# Patient Record
Sex: Male | Born: 1945 | Race: White | Hispanic: No | Marital: Married | Smoking: Former smoker
Health system: Southern US, Community
[De-identification: ages and names within clinical notes are randomized; demographics above are authoritative.]

## PROBLEM LIST (undated history)

## (undated) DIAGNOSIS — C801 Malignant (primary) neoplasm, unspecified: Secondary | ICD-10-CM

## (undated) DIAGNOSIS — E119 Type 2 diabetes mellitus without complications: Secondary | ICD-10-CM

## (undated) DIAGNOSIS — D649 Anemia, unspecified: Secondary | ICD-10-CM

## (undated) DIAGNOSIS — N289 Disorder of kidney and ureter, unspecified: Secondary | ICD-10-CM

## (undated) DIAGNOSIS — H3552 Pigmentary retinal dystrophy: Secondary | ICD-10-CM

## (undated) DIAGNOSIS — I10 Essential (primary) hypertension: Secondary | ICD-10-CM

## (undated) DIAGNOSIS — H543 Unqualified visual loss, both eyes: Secondary | ICD-10-CM

## (undated) DIAGNOSIS — R197 Diarrhea, unspecified: Secondary | ICD-10-CM

## (undated) HISTORY — DX: Anemia, unspecified: D64.9

## (undated) HISTORY — DX: Essential (primary) hypertension: I10

## (undated) HISTORY — DX: Type 2 diabetes mellitus without complications: E11.9

## (undated) HISTORY — PX: COLONOSCOPY: SHX174

---

## 1984-07-12 HISTORY — PX: EYE SURGERY: SHX253

## 1990-07-12 HISTORY — PX: TONSILLECTOMY: SUR1361

## 2006-04-28 ENCOUNTER — Ambulatory Visit (HOSPITAL_BASED_OUTPATIENT_CLINIC_OR_DEPARTMENT_OTHER): Admission: RE | Admit: 2006-04-28 | Discharge: 2006-04-28 | Payer: Self-pay | Admitting: Orthopedic Surgery

## 2006-04-28 ENCOUNTER — Encounter (INDEPENDENT_AMBULATORY_CARE_PROVIDER_SITE_OTHER): Payer: Self-pay | Admitting: Specialist

## 2006-07-12 HISTORY — PX: ELBOW SURGERY: SHX618

## 2007-07-13 HISTORY — PX: RECTAL SURGERY: SHX760

## 2007-09-22 ENCOUNTER — Encounter: Admission: RE | Admit: 2007-09-22 | Discharge: 2007-09-22 | Payer: Self-pay | Admitting: Surgery

## 2007-09-25 ENCOUNTER — Ambulatory Visit: Payer: Self-pay | Admitting: Hematology and Oncology

## 2007-09-28 LAB — COMPREHENSIVE METABOLIC PANEL
AST: 19 U/L (ref 0–37)
Albumin: 4.5 g/dL (ref 3.5–5.2)
Alkaline Phosphatase: 85 U/L (ref 39–117)
Calcium: 9.8 mg/dL (ref 8.4–10.5)
Chloride: 101 mEq/L (ref 96–112)
Glucose, Bld: 217 mg/dL — ABNORMAL HIGH (ref 70–99)
Potassium: 4.4 mEq/L (ref 3.5–5.3)
Sodium: 138 mEq/L (ref 135–145)
Total Protein: 7.4 g/dL (ref 6.0–8.3)

## 2007-09-28 LAB — CBC WITH DIFFERENTIAL/PLATELET
Eosinophils Absolute: 0.2 10*3/uL (ref 0.0–0.5)
MCV: 88.4 fL (ref 81.6–98.0)
MONO%: 6.4 % (ref 0.0–13.0)
NEUT#: 5.9 10*3/uL (ref 1.5–6.5)
RBC: 3.88 10*6/uL — ABNORMAL LOW (ref 4.20–5.71)
RDW: 13.7 % (ref 11.2–14.6)
WBC: 7.3 10*3/uL (ref 4.0–10.0)
lymph#: 0.8 10*3/uL — ABNORMAL LOW (ref 0.9–3.3)

## 2007-10-09 ENCOUNTER — Ambulatory Visit (HOSPITAL_COMMUNITY): Admission: RE | Admit: 2007-10-09 | Discharge: 2007-10-09 | Payer: Self-pay | Admitting: Surgery

## 2007-10-16 ENCOUNTER — Ambulatory Visit: Admission: RE | Admit: 2007-10-16 | Discharge: 2007-12-18 | Payer: Self-pay | Admitting: Radiation Oncology

## 2007-10-25 ENCOUNTER — Ambulatory Visit (HOSPITAL_COMMUNITY): Admission: RE | Admit: 2007-10-25 | Discharge: 2007-10-25 | Payer: Self-pay | Admitting: Hematology and Oncology

## 2007-11-02 LAB — CBC WITH DIFFERENTIAL/PLATELET
BASO%: 0.2 % (ref 0.0–2.0)
Basophils Absolute: 0 10*3/uL (ref 0.0–0.1)
EOS%: 4.4 % (ref 0.0–7.0)
HCT: 36.1 % — ABNORMAL LOW (ref 38.7–49.9)
HGB: 12.8 g/dL — ABNORMAL LOW (ref 13.0–17.1)
MCH: 31.4 pg (ref 28.0–33.4)
MCHC: 35.4 g/dL (ref 32.0–35.9)
MCV: 88.7 fL (ref 81.6–98.0)
MONO%: 7.4 % (ref 0.0–13.0)
NEUT%: 71.8 % (ref 40.0–75.0)

## 2007-11-06 ENCOUNTER — Ambulatory Visit: Payer: Self-pay | Admitting: Hematology and Oncology

## 2007-11-08 LAB — CBC WITH DIFFERENTIAL/PLATELET
BASO%: 0.7 % (ref 0.0–2.0)
Basophils Absolute: 0 10*3/uL (ref 0.0–0.1)
EOS%: 2.8 % (ref 0.0–7.0)
MCH: 31.5 pg (ref 28.0–33.4)
MCHC: 35.6 g/dL (ref 32.0–35.9)
MCV: 88.4 fL (ref 81.6–98.0)
MONO%: 7 % (ref 0.0–13.0)
RBC: 3.85 10*6/uL — ABNORMAL LOW (ref 4.20–5.71)
RDW: 13.9 % (ref 11.2–14.6)
lymph#: 0.9 10*3/uL (ref 0.9–3.3)

## 2007-11-15 LAB — CBC WITH DIFFERENTIAL/PLATELET
Basophils Absolute: 0 10*3/uL (ref 0.0–0.1)
EOS%: 6.8 % (ref 0.0–7.0)
HGB: 11.4 g/dL — ABNORMAL LOW (ref 13.0–17.1)
MCH: 31.7 pg (ref 28.0–33.4)
MCHC: 35.3 g/dL (ref 32.0–35.9)
MCV: 89.7 fL (ref 81.6–98.0)
MONO%: 6.5 % (ref 0.0–13.0)
RBC: 3.6 10*6/uL — ABNORMAL LOW (ref 4.20–5.71)
RDW: 13.8 % (ref 11.2–14.6)

## 2007-11-22 LAB — CBC WITH DIFFERENTIAL/PLATELET
BASO%: 0.2 % (ref 0.0–2.0)
Basophils Absolute: 0 10*3/uL (ref 0.0–0.1)
EOS%: 6.5 % (ref 0.0–7.0)
Eosinophils Absolute: 0.3 10*3/uL (ref 0.0–0.5)
HCT: 33.2 % — ABNORMAL LOW (ref 38.7–49.9)
LYMPH%: 11.2 % — ABNORMAL LOW (ref 14.0–48.0)
MCHC: 35.1 g/dL (ref 32.0–35.9)
MCV: 90 fL (ref 81.6–98.0)
MONO%: 8.6 % (ref 0.0–13.0)
NEUT#: 3.4 10*3/uL (ref 1.5–6.5)
RBC: 3.69 10*6/uL — ABNORMAL LOW (ref 4.20–5.71)
RDW: 14.2 % (ref 11.2–14.6)

## 2007-11-22 LAB — BASIC METABOLIC PANEL
CO2: 22 mEq/L (ref 19–32)
Calcium: 9 mg/dL (ref 8.4–10.5)
Glucose, Bld: 190 mg/dL — ABNORMAL HIGH (ref 70–99)
Potassium: 4.2 mEq/L (ref 3.5–5.3)
Sodium: 137 mEq/L (ref 135–145)

## 2007-11-29 LAB — CBC WITH DIFFERENTIAL/PLATELET
BASO%: 0.9 % (ref 0.0–2.0)
Basophils Absolute: 0.1 10*3/uL (ref 0.0–0.1)
EOS%: 6.7 % (ref 0.0–7.0)
HCT: 31.9 % — ABNORMAL LOW (ref 38.7–49.9)
HGB: 11.5 g/dL — ABNORMAL LOW (ref 13.0–17.1)
LYMPH%: 9.5 % — ABNORMAL LOW (ref 14.0–48.0)
MCH: 32.5 pg (ref 28.0–33.4)
MCHC: 36.1 g/dL — ABNORMAL HIGH (ref 32.0–35.9)
MONO#: 0.6 10*3/uL (ref 0.1–0.9)
NEUT%: 73.3 % (ref 40.0–75.0)
Platelets: 158 10*3/uL (ref 145–400)
lymph#: 0.6 10*3/uL — ABNORMAL LOW (ref 0.9–3.3)

## 2008-01-09 ENCOUNTER — Ambulatory Visit: Payer: Self-pay | Admitting: Hematology and Oncology

## 2008-01-09 ENCOUNTER — Ambulatory Visit (HOSPITAL_COMMUNITY): Admission: RE | Admit: 2008-01-09 | Discharge: 2008-01-09 | Payer: Self-pay | Admitting: Hematology and Oncology

## 2008-01-11 LAB — CBC WITH DIFFERENTIAL/PLATELET
BASO%: 0.5 % (ref 0.0–2.0)
Basophils Absolute: 0 10*3/uL (ref 0.0–0.1)
EOS%: 9.8 % — ABNORMAL HIGH (ref 0.0–7.0)
Eosinophils Absolute: 0.6 10*3/uL — ABNORMAL HIGH (ref 0.0–0.5)
HCT: 33.6 % — ABNORMAL LOW (ref 38.7–49.9)
HGB: 11.9 g/dL — ABNORMAL LOW (ref 13.0–17.1)
LYMPH%: 7.5 % — ABNORMAL LOW (ref 14.0–48.0)
MCH: 33.5 pg — ABNORMAL HIGH (ref 28.0–33.4)
MCHC: 35.5 g/dL (ref 32.0–35.9)
MCV: 94.3 fL (ref 81.6–98.0)
MONO#: 0.4 10*3/uL (ref 0.1–0.9)
MONO%: 6.4 % (ref 0.0–13.0)
NEUT#: 4.4 10*3/uL (ref 1.5–6.5)
NEUT%: 75.8 % — ABNORMAL HIGH (ref 40.0–75.0)
Platelets: 200 10*3/uL (ref 145–400)
RBC: 3.56 10*6/uL — ABNORMAL LOW (ref 4.20–5.71)
RDW: 18.4 % — ABNORMAL HIGH (ref 11.2–14.6)
WBC: 5.8 10*3/uL (ref 4.0–10.0)
lymph#: 0.4 10*3/uL — ABNORMAL LOW (ref 0.9–3.3)

## 2008-01-11 LAB — COMPREHENSIVE METABOLIC PANEL
ALT: 21 U/L (ref 0–53)
AST: 19 U/L (ref 0–37)
Albumin: 4.5 g/dL (ref 3.5–5.2)
Calcium: 9.4 mg/dL (ref 8.4–10.5)
Chloride: 101 mEq/L (ref 96–112)
Potassium: 4.6 mEq/L (ref 3.5–5.3)
Sodium: 138 mEq/L (ref 135–145)
Total Protein: 7.4 g/dL (ref 6.0–8.3)

## 2008-01-18 ENCOUNTER — Inpatient Hospital Stay (HOSPITAL_COMMUNITY): Admission: RE | Admit: 2008-01-18 | Discharge: 2008-01-23 | Payer: Self-pay | Admitting: Surgery

## 2008-01-18 ENCOUNTER — Encounter (INDEPENDENT_AMBULATORY_CARE_PROVIDER_SITE_OTHER): Payer: Self-pay | Admitting: Surgery

## 2008-01-23 ENCOUNTER — Encounter (INDEPENDENT_AMBULATORY_CARE_PROVIDER_SITE_OTHER): Payer: Self-pay | Admitting: Surgery

## 2008-01-23 ENCOUNTER — Ambulatory Visit: Payer: Self-pay | Admitting: Vascular Surgery

## 2008-02-02 ENCOUNTER — Emergency Department (HOSPITAL_COMMUNITY): Admission: EM | Admit: 2008-02-02 | Discharge: 2008-02-02 | Payer: Self-pay | Admitting: Emergency Medicine

## 2008-02-25 ENCOUNTER — Ambulatory Visit: Payer: Self-pay | Admitting: Hematology and Oncology

## 2008-03-01 LAB — CBC WITH DIFFERENTIAL/PLATELET
Eosinophils Absolute: 0.5 10*3/uL (ref 0.0–0.5)
LYMPH%: 6.4 % — ABNORMAL LOW (ref 14.0–48.0)
MCV: 87.7 fL (ref 81.6–98.0)
MONO%: 6.5 % (ref 0.0–13.0)
NEUT#: 6.4 10*3/uL (ref 1.5–6.5)
Platelets: 267 10*3/uL (ref 145–400)
RBC: 3.42 10*6/uL — ABNORMAL LOW (ref 4.20–5.71)

## 2008-03-01 LAB — COMPREHENSIVE METABOLIC PANEL
Alkaline Phosphatase: 68 U/L (ref 39–117)
BUN: 23 mg/dL (ref 6–23)
Glucose, Bld: 168 mg/dL — ABNORMAL HIGH (ref 70–99)
Sodium: 134 mEq/L — ABNORMAL LOW (ref 135–145)
Total Bilirubin: 0.3 mg/dL (ref 0.3–1.2)
Total Protein: 6.6 g/dL (ref 6.0–8.3)

## 2008-03-15 ENCOUNTER — Ambulatory Visit (HOSPITAL_COMMUNITY): Admission: RE | Admit: 2008-03-15 | Discharge: 2008-03-15 | Payer: Self-pay | Admitting: Surgery

## 2008-04-12 ENCOUNTER — Ambulatory Visit: Payer: Self-pay | Admitting: Hematology and Oncology

## 2008-04-12 ENCOUNTER — Ambulatory Visit (HOSPITAL_COMMUNITY): Admission: RE | Admit: 2008-04-12 | Discharge: 2008-04-12 | Payer: Self-pay | Admitting: Surgery

## 2008-05-06 ENCOUNTER — Ambulatory Visit (HOSPITAL_COMMUNITY): Admission: RE | Admit: 2008-05-06 | Discharge: 2008-05-06 | Payer: Self-pay | Admitting: Surgery

## 2008-07-15 ENCOUNTER — Ambulatory Visit: Payer: Self-pay | Admitting: Hematology and Oncology

## 2008-08-23 ENCOUNTER — Inpatient Hospital Stay (HOSPITAL_COMMUNITY): Admission: EM | Admit: 2008-08-23 | Discharge: 2008-08-27 | Payer: Self-pay | Admitting: Emergency Medicine

## 2008-10-04 ENCOUNTER — Ambulatory Visit: Payer: Self-pay | Admitting: Hematology and Oncology

## 2008-11-18 ENCOUNTER — Ambulatory Visit: Payer: Self-pay | Admitting: Hematology and Oncology

## 2008-11-22 LAB — CBC WITH DIFFERENTIAL/PLATELET
BASO%: 0.1 % (ref 0.0–2.0)
EOS%: 2.6 % (ref 0.0–7.0)
LYMPH%: 4.8 % — ABNORMAL LOW (ref 14.0–49.0)
MCH: 29.2 pg (ref 27.2–33.4)
MCHC: 33.6 g/dL (ref 32.0–36.0)
MCV: 87.1 fL (ref 79.3–98.0)
MONO%: 6.8 % (ref 0.0–14.0)
Platelets: 224 10*3/uL (ref 140–400)
RBC: 3.34 10*6/uL — ABNORMAL LOW (ref 4.20–5.82)

## 2008-11-22 LAB — COMPREHENSIVE METABOLIC PANEL
ALT: 12 U/L (ref 0–53)
Alkaline Phosphatase: 75 U/L (ref 39–117)
Glucose, Bld: 168 mg/dL — ABNORMAL HIGH (ref 70–99)
Sodium: 138 mEq/L (ref 135–145)
Total Bilirubin: 0.9 mg/dL (ref 0.3–1.2)
Total Protein: 7 g/dL (ref 6.0–8.3)

## 2008-11-26 ENCOUNTER — Ambulatory Visit (HOSPITAL_COMMUNITY): Admission: RE | Admit: 2008-11-26 | Discharge: 2008-11-26 | Payer: Self-pay | Admitting: Hematology and Oncology

## 2008-12-06 ENCOUNTER — Encounter (HOSPITAL_COMMUNITY): Admission: RE | Admit: 2008-12-06 | Discharge: 2009-02-25 | Payer: Self-pay | Admitting: Nephrology

## 2009-01-03 ENCOUNTER — Ambulatory Visit: Payer: Self-pay | Admitting: Hematology and Oncology

## 2009-02-12 ENCOUNTER — Ambulatory Visit: Payer: Self-pay | Admitting: Hematology and Oncology

## 2009-03-19 ENCOUNTER — Ambulatory Visit: Payer: Self-pay | Admitting: Hematology and Oncology

## 2009-03-21 ENCOUNTER — Ambulatory Visit (HOSPITAL_COMMUNITY): Admission: RE | Admit: 2009-03-21 | Discharge: 2009-03-21 | Payer: Self-pay | Admitting: Hematology and Oncology

## 2009-03-21 LAB — COMPREHENSIVE METABOLIC PANEL
AST: 20 U/L (ref 0–37)
Albumin: 3.9 g/dL (ref 3.5–5.2)
Alkaline Phosphatase: 84 U/L (ref 39–117)
Chloride: 99 mEq/L (ref 96–112)
Glucose, Bld: 152 mg/dL — ABNORMAL HIGH (ref 70–99)
Potassium: 4 mEq/L (ref 3.5–5.3)
Sodium: 136 mEq/L (ref 135–145)
Total Protein: 7.2 g/dL (ref 6.0–8.3)

## 2009-03-21 LAB — CBC WITH DIFFERENTIAL/PLATELET
EOS%: 4.6 % (ref 0.0–7.0)
Eosinophils Absolute: 0.3 10*3/uL (ref 0.0–0.5)
MCV: 90.5 fL (ref 79.3–98.0)
MONO%: 6.4 % (ref 0.0–14.0)
NEUT#: 4.9 10*3/uL (ref 1.5–6.5)
RBC: 3.4 10*6/uL — ABNORMAL LOW (ref 4.20–5.82)
RDW: 14.6 % (ref 11.0–14.6)
lymph#: 0.5 10*3/uL — ABNORMAL LOW (ref 0.9–3.3)

## 2009-03-31 ENCOUNTER — Ambulatory Visit (HOSPITAL_COMMUNITY): Admission: RE | Admit: 2009-03-31 | Discharge: 2009-03-31 | Payer: Self-pay | Admitting: Hematology and Oncology

## 2009-07-21 ENCOUNTER — Ambulatory Visit: Payer: Self-pay | Admitting: Hematology and Oncology

## 2009-07-23 LAB — COMPREHENSIVE METABOLIC PANEL
Albumin: 3.8 g/dL (ref 3.5–5.2)
Alkaline Phosphatase: 89 U/L (ref 39–117)
Chloride: 104 mEq/L (ref 96–112)
Potassium: 4.2 mEq/L (ref 3.5–5.3)
Sodium: 137 mEq/L (ref 135–145)
Total Bilirubin: 0.8 mg/dL (ref 0.3–1.2)
Total Protein: 7.1 g/dL (ref 6.0–8.3)

## 2009-07-23 LAB — CBC WITH DIFFERENTIAL/PLATELET
Basophils Absolute: 0 10*3/uL (ref 0.0–0.1)
EOS%: 4.3 % (ref 0.0–7.0)
HGB: 10.3 g/dL — ABNORMAL LOW (ref 13.0–17.1)
LYMPH%: 9.1 % — ABNORMAL LOW (ref 14.0–49.0)
MCH: 32.2 pg (ref 27.2–33.4)
MCV: 93.2 fL (ref 79.3–98.0)
MONO#: 0.6 10*3/uL (ref 0.1–0.9)
RBC: 3.18 10*6/uL — ABNORMAL LOW (ref 4.20–5.82)
WBC: 6.5 10*3/uL (ref 4.0–10.3)
lymph#: 0.6 10*3/uL — ABNORMAL LOW (ref 0.9–3.3)

## 2009-07-25 ENCOUNTER — Ambulatory Visit (HOSPITAL_COMMUNITY): Admission: RE | Admit: 2009-07-25 | Discharge: 2009-07-25 | Payer: Self-pay | Admitting: Hematology and Oncology

## 2009-07-25 LAB — VITAMIN B12: Vitamin B-12: 363 pg/mL (ref 211–911)

## 2009-07-25 LAB — IRON AND TIBC
Iron: 92 ug/dL (ref 42–165)
UIBC: 206 ug/dL

## 2009-07-25 LAB — FERRITIN: Ferritin: 190 ng/mL (ref 22–322)

## 2010-01-01 ENCOUNTER — Ambulatory Visit: Payer: Self-pay | Admitting: Hematology and Oncology

## 2010-01-05 ENCOUNTER — Ambulatory Visit (HOSPITAL_COMMUNITY): Admission: RE | Admit: 2010-01-05 | Discharge: 2010-01-05 | Payer: Self-pay | Admitting: Hematology and Oncology

## 2010-01-05 LAB — CBC WITH DIFFERENTIAL/PLATELET
BASO%: 0.4 % (ref 0.0–2.0)
EOS%: 4.9 % (ref 0.0–7.0)
HCT: 30.6 % — ABNORMAL LOW (ref 38.4–49.9)
LYMPH%: 7.4 % — ABNORMAL LOW (ref 14.0–49.0)
MCHC: 35.2 g/dL (ref 32.0–36.0)
MCV: 90.1 fL (ref 79.3–98.0)
NEUT%: 81 % — ABNORMAL HIGH (ref 39.0–75.0)
Platelets: 201 10*3/uL (ref 140–400)
WBC: 7.4 10*3/uL (ref 4.0–10.3)
lymph#: 0.5 10*3/uL — ABNORMAL LOW (ref 0.9–3.3)

## 2010-01-05 LAB — COMPREHENSIVE METABOLIC PANEL
ALT: 24 U/L (ref 0–53)
AST: 23 U/L (ref 0–37)
Albumin: 3.8 g/dL (ref 3.5–5.2)
Alkaline Phosphatase: 89 U/L (ref 39–117)
BUN: 55 mg/dL — ABNORMAL HIGH (ref 6–23)
CO2: 27 mEq/L (ref 19–32)
Creatinine, Ser: 3 mg/dL — ABNORMAL HIGH (ref 0.40–1.50)
Total Bilirubin: 0.8 mg/dL (ref 0.3–1.2)
Total Protein: 7.3 g/dL (ref 6.0–8.3)

## 2010-02-15 IMAGING — CT CT PELVIS W/ CM
2 of 3 series · 16 of 46 positions shown, 18 images · IV contrast (omnipaque)
Comparison: None.

CLINICAL DATA: 61-year-old, rectal cancer.  
 CHEST CT WITHOUT CONTRAST:
TECHNIQUE: Multidetector CT imaging of the chest was performed following the standard protocol without IV contrast.
TECHNIQUE: Multidetector CT imaging of the abdomen was performed following the standard protocol during bolus administration of intravenous contrast.
 Contrast:  125 cc Omnipaque 300
TECHNIQUE: Multidetector CT imaging of the pelvis was performed following the standard protocol during bolus administration of intravenous contrast.

[Series 3: routine chest · axial · 0.70mm/px · z∈[-236,+24]mm · 13 of 60 slices shown, 15 images]
[im 4/60  soft-tissue]
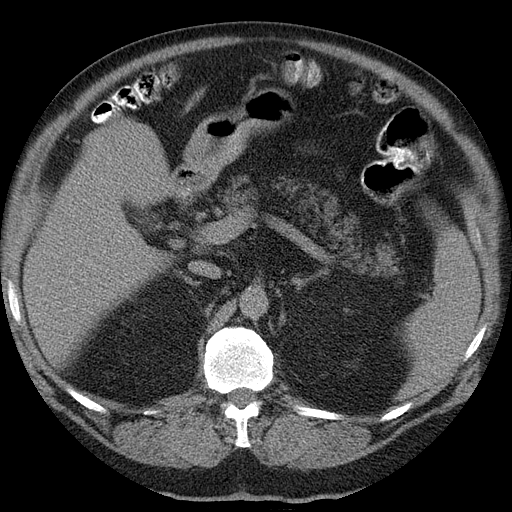
[im 4/60  bone]
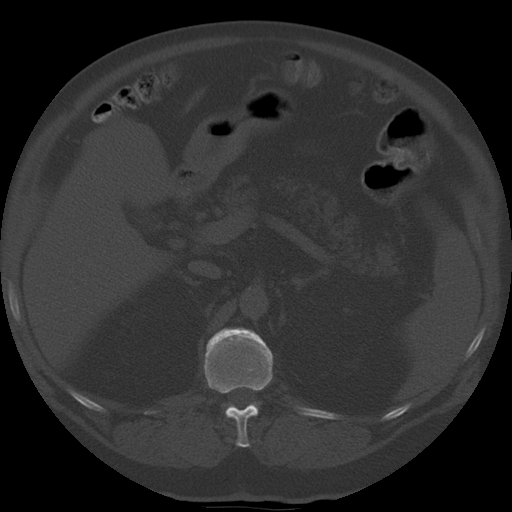
[im 8/60  soft-tissue]
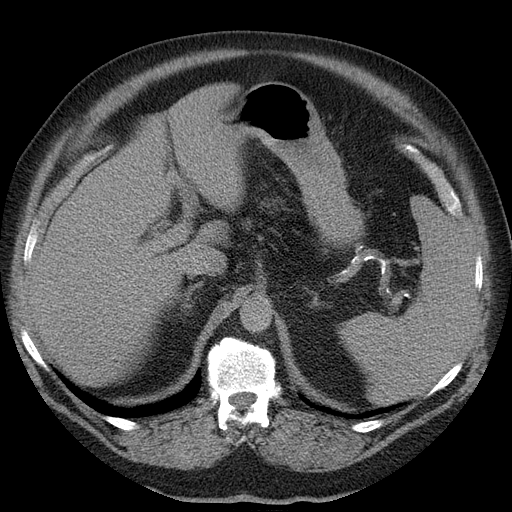
[im 12/60  soft-tissue]
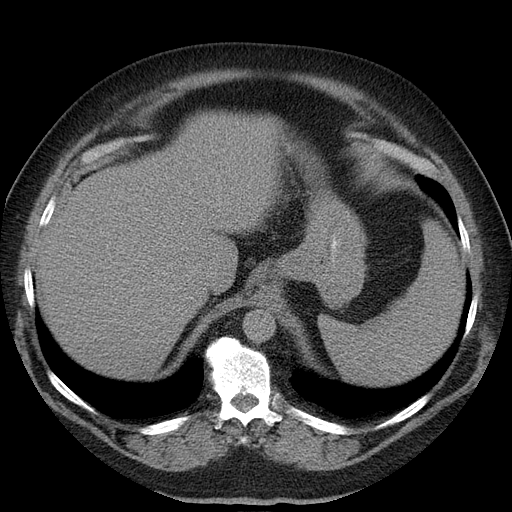
[im 18/60  soft-tissue]
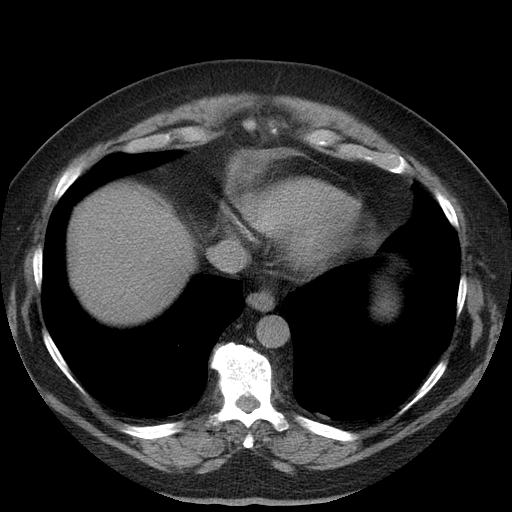
[im 21/60  soft-tissue]
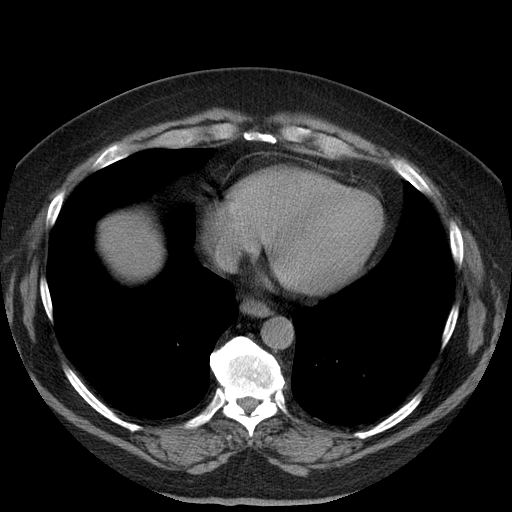
[im 25/60  soft-tissue]
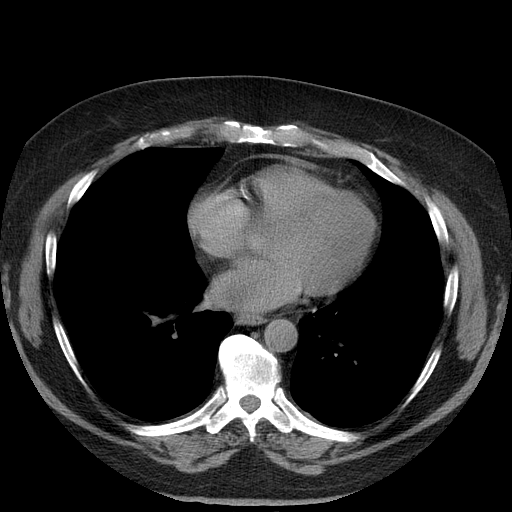
[im 31/60  soft-tissue]
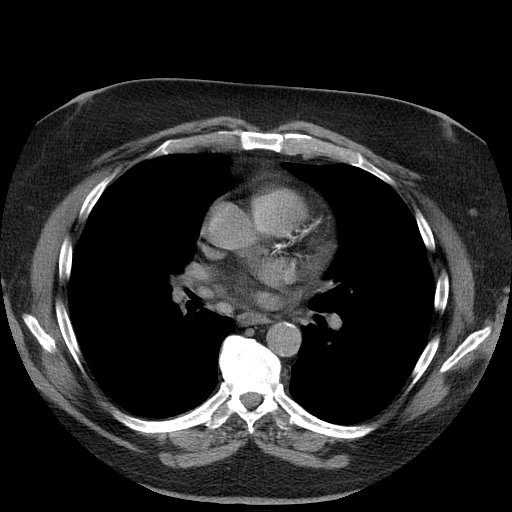
[im 35/60  soft-tissue]
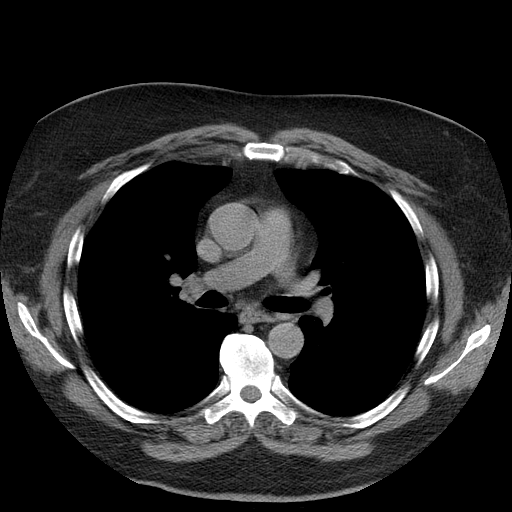
[im 39/60  soft-tissue]
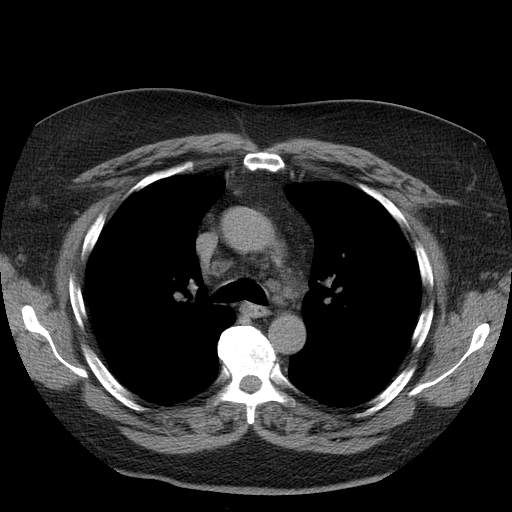
[im 39/60  bone]
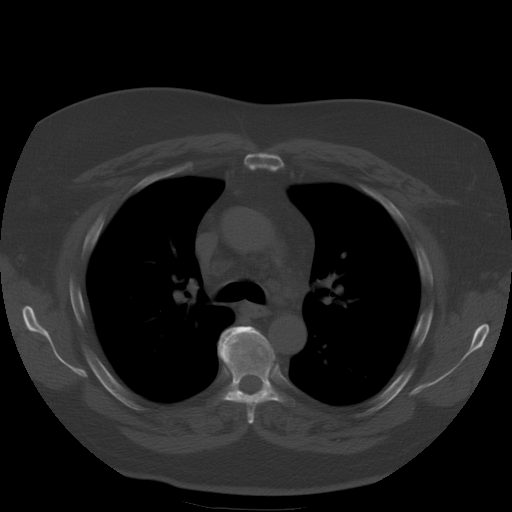
[im 42/60  soft-tissue]
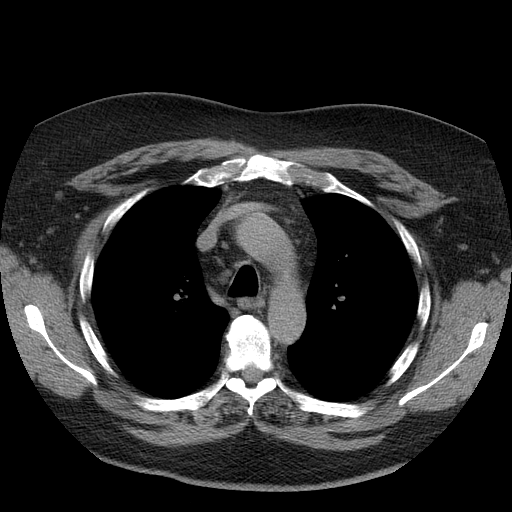
[im 48/60  soft-tissue]
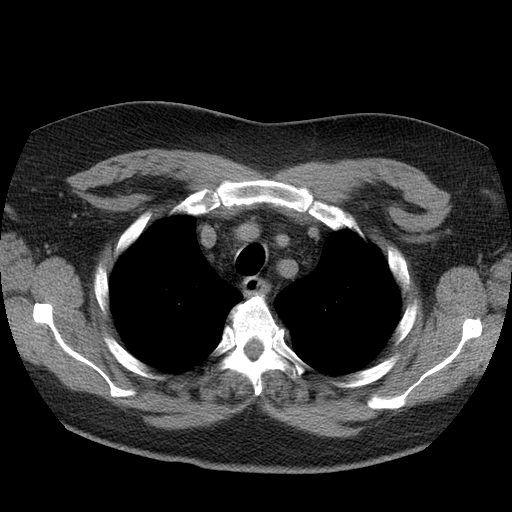
[im 52/60  soft-tissue]
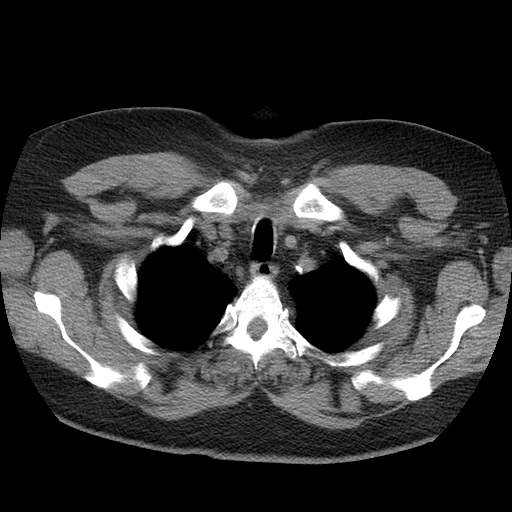
[im 56/60  soft-tissue]
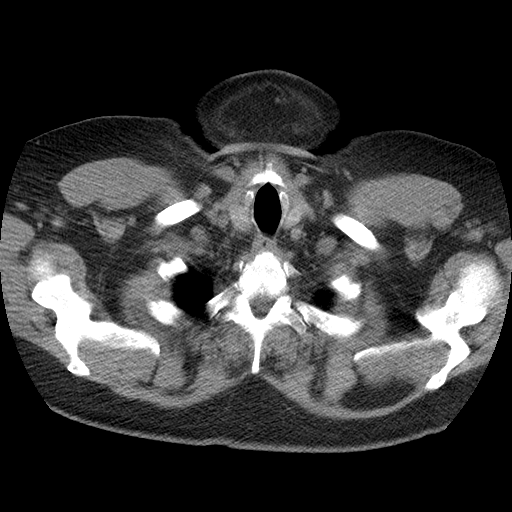

[Series 602: sagittal body · sagittal · 0.70mm/px · 3 of 145 slices shown]
[im 49/145  soft-tissue]
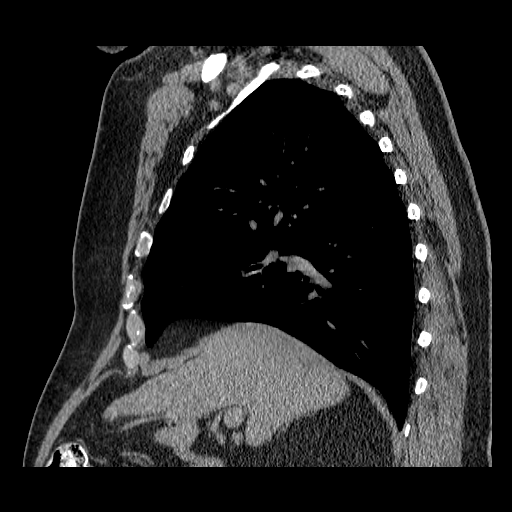
[im 65/145  soft-tissue]
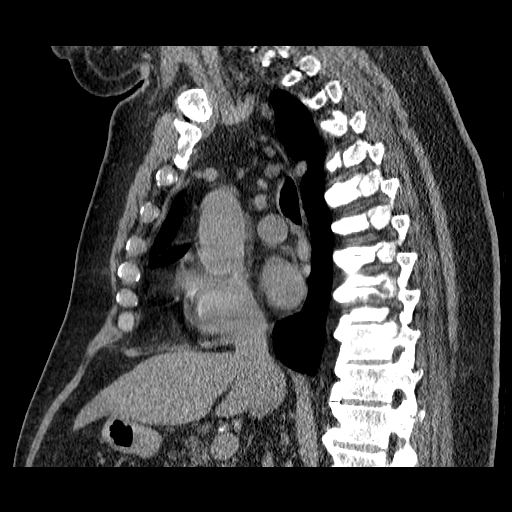
[im 81/145  soft-tissue]
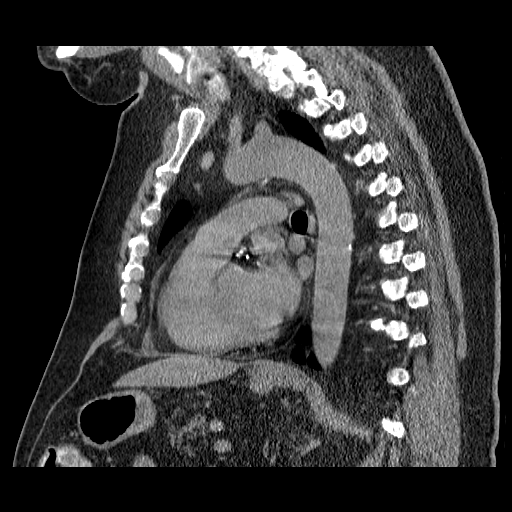

[16 of 46 positions shown; findings below may reference images not displayed]

FINDINGS: No supraclavicular or axillary adenopathy.  Heart size is within normal limits.  Small amount of pericardial fluid but no effusion.  Coronary artery calcifications are noted.  Small scattered mediastinal and hilar lymph nodes.  
 The aorta is normal in caliber.  No dissection.  
 The esophagus is grossly normal.  Small hiatal hernia.  
 Examination of the lung parenchyma demonstrates no acute pulmonary findings.  No masses or nodules.  No pleural effusions.  The tracheobronchial tree appears normal.
IMPRESSION: 1.  No acute pulmonary findings and no mass lesions or nodules. 
 2.  A few small scattered mediastinal nodes but no adenopathy.
 3.  Coronary artery calcifications.  
 4.  Small hiatal hernia. 
 ABDOMEN CT WITH CONTRAST:
FINDINGS: No evidence for hepatic metastatic disease.  No biliary dilatation.  The gallbladder appears normal.  The spleen is normal in size.  The pancreas is unremarkable.  Mild fatty marbling.  
 The adrenal glands and kidneys demonstrate no significant findings. 
 The stomach, duodenum, small bowel, and colon demonstrate no significant abnormalities. 
 The aorta is normal in caliber.  Major branch vessels are normal.  Mild atherosclerotic changes.  
 There are a few small celiac axis and portal region lymph nodes but no adenopathy.  No significant bony findings.  There are degenerative changes throughout the spine.
IMPRESSION: No acute abdominal findings, mass lesions, or adenopathy.  There are borderline celiac axis and periportal lymph nodes.  No hepatic metastatic disease.  
 PELVIS CT WITH CONTRAST:
FINDINGS: The appendix is visualized and is normal.  Surgical changes involving the low rectum.  No recurrent tumor is seen.  No pelvic adenopathy.  Sigmoid colon and small bowel loops are unremarkable.  The bladder appears normal.  The seminal vesicles and prostate glands are unremarkable.  No inguinal masses or adenopathy.  
 No significant bony findings.  Mild SI joint degenerative changes.
IMPRESSION: Surgical change involving the low rectum.  No evidence for rectal mass or pararectal masses or adenopathy.

## 2010-06-15 LAB — CBC WITH DIFFERENTIAL/PLATELET
EOS%: 3.3 % (ref 0.0–7.0)
HGB: 10.9 g/dL — ABNORMAL LOW (ref 13.0–17.1)
MCHC: 34 g/dL (ref 32.0–36.0)
MCV: 92.2 fL (ref 79.3–98.0)
MONO#: 0.6 10*3/uL (ref 0.1–0.9)
NEUT%: 82.8 % — ABNORMAL HIGH (ref 39.0–75.0)
RBC: 3.49 10*6/uL — ABNORMAL LOW (ref 4.20–5.82)
RDW: 15.2 % — ABNORMAL HIGH (ref 11.0–14.6)
WBC: 9 10*3/uL (ref 4.0–10.3)

## 2010-06-15 LAB — COMPREHENSIVE METABOLIC PANEL
CO2: 26 mEq/L (ref 19–32)
Calcium: 9 mg/dL (ref 8.4–10.5)
Glucose, Bld: 71 mg/dL (ref 70–99)
Sodium: 140 mEq/L (ref 135–145)

## 2010-06-15 LAB — CEA: CEA: 2.2 ng/mL (ref 0.0–5.0)

## 2010-06-16 ENCOUNTER — Ambulatory Visit: Payer: Self-pay | Admitting: Hematology and Oncology

## 2010-07-31 ENCOUNTER — Other Ambulatory Visit: Payer: Self-pay | Admitting: Hematology and Oncology

## 2010-07-31 DIAGNOSIS — C20 Malignant neoplasm of rectum: Secondary | ICD-10-CM

## 2010-10-27 LAB — CBC
HCT: 26.1 % — ABNORMAL LOW (ref 39.0–52.0)
HCT: 27.7 % — ABNORMAL LOW (ref 39.0–52.0)
Hemoglobin: 12.6 g/dL — ABNORMAL LOW (ref 13.0–17.0)
Hemoglobin: 9.1 g/dL — ABNORMAL LOW (ref 13.0–17.0)
MCHC: 34.6 g/dL (ref 30.0–36.0)
MCHC: 34.8 g/dL (ref 30.0–36.0)
MCHC: 34.8 g/dL (ref 30.0–36.0)
MCV: 90.3 fL (ref 78.0–100.0)
MCV: 90.5 fL (ref 78.0–100.0)
Platelets: 119 10*3/uL — ABNORMAL LOW (ref 150–400)
Platelets: 129 10*3/uL — ABNORMAL LOW (ref 150–400)
Platelets: 203 10*3/uL (ref 150–400)
RBC: 2.87 MIL/uL — ABNORMAL LOW (ref 4.22–5.81)
RDW: 13.5 % (ref 11.5–15.5)
RDW: 13.7 % (ref 11.5–15.5)
RDW: 14.1 % (ref 11.5–15.5)
RDW: 14.2 % (ref 11.5–15.5)

## 2010-10-27 LAB — BASIC METABOLIC PANEL
BUN: 106 mg/dL — ABNORMAL HIGH (ref 6–23)
BUN: 76 mg/dL — ABNORMAL HIGH (ref 6–23)
BUN: 37 mg/dL — ABNORMAL HIGH (ref 6–23)
CO2: 22 mEq/L (ref 19–32)
CO2: 24 mEq/L (ref 19–32)
CO2: 21 mEq/L (ref 19–32)
Calcium: 7.7 mg/dL — ABNORMAL LOW (ref 8.4–10.5)
Calcium: 8.1 mg/dL — ABNORMAL LOW (ref 8.4–10.5)
Calcium: 7.5 mg/dL — ABNORMAL LOW (ref 8.4–10.5)
Chloride: 95 mEq/L — ABNORMAL LOW (ref 96–112)
Chloride: 98 mEq/L (ref 96–112)
Chloride: 108 mEq/L (ref 96–112)
Creatinine, Ser: 3.68 mg/dL — ABNORMAL HIGH (ref 0.4–1.5)
Creatinine, Ser: 5.04 mg/dL — ABNORMAL HIGH (ref 0.4–1.5)
Creatinine, Ser: 2.76 mg/dL — ABNORMAL HIGH (ref 0.4–1.5)
GFR calc Af Amer: 14 mL/min — ABNORMAL LOW (ref >60)
GFR calc Af Amer: 20 mL/min — ABNORMAL LOW (ref >60)
GFR calc Af Amer: 27 mL/min — ABNORMAL LOW (ref >60)
GFR calc Af Amer: 28 mL/min — ABNORMAL LOW (ref >60)
Glucose, Bld: 104 mg/dL — ABNORMAL HIGH (ref 70–99)
Glucose, Bld: 117 mg/dL — ABNORMAL HIGH (ref 70–99)
Glucose, Bld: 128 mg/dL — ABNORMAL HIGH (ref 70–99)
Potassium: 2.7 mEq/L — CL (ref 3.5–5.1)
Potassium: 3.1 mEq/L — ABNORMAL LOW (ref 3.5–5.1)
Sodium: 129 mEq/L — ABNORMAL LOW (ref 135–145)

## 2010-10-27 LAB — GLUCOSE, CAPILLARY
Glucose-Capillary: 100 mg/dL — ABNORMAL HIGH (ref 70–99)
Glucose-Capillary: 111 mg/dL — ABNORMAL HIGH (ref 70–99)
Glucose-Capillary: 116 mg/dL — ABNORMAL HIGH (ref 70–99)
Glucose-Capillary: 146 mg/dL — ABNORMAL HIGH (ref 70–99)
Glucose-Capillary: 152 mg/dL — ABNORMAL HIGH (ref 70–99)
Glucose-Capillary: 177 mg/dL — ABNORMAL HIGH (ref 70–99)
Glucose-Capillary: 205 mg/dL — ABNORMAL HIGH (ref 70–99)
Glucose-Capillary: 216 mg/dL — ABNORMAL HIGH (ref 70–99)
Glucose-Capillary: 217 mg/dL — ABNORMAL HIGH (ref 70–99)

## 2010-10-27 LAB — PROTEIN ELECTROPH W RFLX QUANT IMMUNOGLOBULINS
Albumin ELP: 53.9 % — ABNORMAL LOW (ref 55.8–66.1)
Alpha-1-Globulin: 5.8 % — ABNORMAL HIGH (ref 2.9–4.9)
Alpha-2-Globulin: 15.4 % — ABNORMAL HIGH (ref 7.1–11.8)
Total Protein ELP: 5.5 g/dL — ABNORMAL LOW (ref 6.0–8.3)

## 2010-10-27 LAB — URINE CULTURE: Colony Count: NO GROWTH

## 2010-10-27 LAB — URINALYSIS, ROUTINE W REFLEX MICROSCOPIC
Glucose, UA: NEGATIVE mg/dL
Hgb urine dipstick: NEGATIVE
Nitrite: NEGATIVE
Protein, ur: NEGATIVE mg/dL

## 2010-10-27 LAB — COMPREHENSIVE METABOLIC PANEL
ALT: 26 U/L (ref 0–53)
Albumin: 3.6 g/dL (ref 3.5–5.2)
Calcium: 9.5 mg/dL (ref 8.4–10.5)
GFR calc Af Amer: 11 mL/min — ABNORMAL LOW (ref >60)
Glucose, Bld: 240 mg/dL — ABNORMAL HIGH (ref 70–99)
Sodium: 126 mEq/L — ABNORMAL LOW (ref 135–145)
Total Protein: 7.3 g/dL (ref 6.0–8.3)

## 2010-10-27 LAB — DIFFERENTIAL
Eosinophils Absolute: 0.1 10*3/uL (ref 0.0–0.7)
Eosinophils Absolute: 0.2 10*3/uL (ref 0.0–0.7)
Eosinophils Relative: 2 % (ref 0–5)
Lymphs Abs: 0.5 10*3/uL — ABNORMAL LOW (ref 0.7–4.0)
Lymphs Abs: 1.1 10*3/uL (ref 0.7–4.0)
Monocytes Absolute: 0.5 10*3/uL (ref 0.1–1.0)
Monocytes Relative: 7 % (ref 3–12)
Neutrophils Relative %: 85 % — ABNORMAL HIGH (ref 43–77)

## 2010-10-27 LAB — UIFE/LIGHT CHAINS/TP QN, 24-HR UR
Albumin, U: DETECTED
Alpha 2, Urine: DETECTED — AB
Beta, Urine: DETECTED — AB
Free Lambda Lt Chains,Ur: 4.97 mg/dL — ABNORMAL HIGH (ref 0.08–1.01)
Gamma Globulin, Urine: DETECTED — AB
Total Protein, Urine-Ur/day: 302 mg/d — ABNORMAL HIGH (ref 10–140)

## 2010-10-27 LAB — SODIUM, URINE, RANDOM: Sodium, Ur: 21 mEq/L

## 2010-11-24 NOTE — Consult Note (Signed)
NAMECORTLAN, Eugene [name] NO.:  [id number]   MEDICAL RECORD NO.:  [id number]          PATIENT TYPE:  INP   LOCATION:  2921                         FACILITY:  [facility]   PHYSICIAN:  Maree Krabbe, M.D.DATE OF BIRTH:  1946/06/14   DATE OF CONSULTATION:  08/23/2008  DATE OF DISCHARGE:                                 CONSULTATION   REASON FOR CONSULT:  Elevated creatinine.   HISTORY:  This is a 65 year old white male with history of rectal cancer  diagnosed in July 2009 treated with resection, ileostomy, and  neoadjuvant chemoradiotherapy.  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  He also has diabetes and hypertension both for less than 2 years.  His  creatinine was 1.98 in July 2009.   The patient was admitted earlier today with 42-month history of  progressive fatigue and a 3-week history of nausea, dry heaves, and poor  oral intake.  In the past 3 weeks, he has had a skin abscess treated  with a course of doxycycline as well as a rectal abscess requiring drain  placement about 3 weeks ago in [place].  He still has the drain in place.  The patient was admitted with acute on chronic renal failure with  creatinine of 6.1, BUN 139.  He has not been recently taking any ACE  inhibitors, ARBs, NSAIDs, or COX-2 agents.  He has had poor oral intake,  with nausea, dry heaves and some vomiting for several days at least.   The patient denies any fever, chills, sweats, weight loss, headache,  sore throat, difficulty swallowing.  He has not been eating well for  quite some time.  No chest pain, shortness of breath, and denies  abdominal pain.  He has an ileostomy.  Denies any bloody stool.  Denies  any difficulty voiding, dysuria, or history of prostate disease.  Denies  any history of arthritis, joint pain or swelling.  Denies any history of  stroke, TIA, seizure, focal numbness or weakness.   PAST MEDICAL HISTORY:  Retinitis pigmentosa, diabetes less than 2 years,  hypertension  less than 2 years.   MEDICATIONS ON ADMISSION:  Metformin, Crestor, fish oil, Imodium, Cipro.   ALLERGIES AND INTOLERANCES:  DILAUDID and REGLAN.   SOCIAL HISTORY:  Quit smoking 30 years ago.  Denies alcohol.   FAMILY HISTORY:  His dad had heart disease.   REVIEW OF SYSTEMS:  As above.   PHYSICAL EXAMINATION:  GENERAL:  The patient is alert and oriented x3,  well-developed, well-nourished white male, in no distress.  He does not  look toxic.  VITAL SIGNS:  Blood pressure 113/60, pulse 80, respirations 18, O2 sat  100% on room air.  The patient is lying flat:  Oriented x3.  SKIN:  Warm and dry.  HEENT:  PERRLA, EOMI.  Throat is slightly dry.  NECK:  Supple with flat neck veins.  CHEST:  Clear throughout.  CARDIAC:  Regular rate and rhythm without murmur, rub, or gallop.  ABDOMEN:  Soft, nontender, nondistended.  Active bowel sounds.  There is  a right-sided ileostomy bag with dark, greenish, liquid stool  in place.  GENITALIA:  Normal male genitalia with no catheter in place.  EXTREMITIES:  No lower extremity or upper extremity edema.  He has good  pulses in both feet 2+/4.  NEUROLOGIC:  Alert and oriented x3.  Grossly nonfocal motor exam.   ADMISSION LABS:  Sodium 126, potassium 3.8, serum bicarbonate 14, BUN  139, creatinine 6.1.  UA showed no protein, no cells.  Liver enzymes  were normal.  Albumin was 3.6, calcium 9.5, total protein 7.3,  hemoglobin 12.6, white blood count 15,000, platelet 203,000.  Renal  ultrasound done today showed normal kidneys, 11 cm on the right, 12 cm  on the left.  Chest x-ray was unremarkable.  No active disease.   IMPRESSION:  1. Acute on chronic renal failure due to volume depletion.  2. Chronic kidney disease.  Baseline creatinine 1.98 in July 2009;      benign urinalysis and normal renal ultrasound this admission.  3. 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  4. Diabetes 2 years duration.  5. Hypertension 2 years duration.  6. History of  rectal cancer treated with resection, ileostomy, and      chemoradiotherapy in summer of 2009.  No signs of any recurrence so      far.   RECOMMENDATIONS:  Increase fluids.  Check SPEP and UPEP.  Given the  benign UA and urinalysis and ultrasound, would not pursue any other CKD  work-up besides myeloma screening at this time.      Maree Krabbe, M.D.  Electronically Signed     RDS/MEDQ  D:  08/24/2008  T:  08/24/2008  Job:  [id number]

## 2010-11-24 NOTE — Op Note (Signed)
NAMEQUINTARIUS, [name]               ACCOUNT NO.:  [id number]   MEDICAL RECORD NO.:  [id number]          PATIENT TYPE:  AMB   LOCATION:  DAY                          FACILITY:  [facility]   PHYSICIAN:  Sandria Bales. Ezzard Standing, M.D.  DATE OF BIRTH:  [birth year]   DATE OF PROCEDURE:  10/09/2007  DATE OF DISCHARGE:                               OPERATIVE REPORT   Date of surgery here ???   PREOPERATIVE DIAGNOSIS:  Rectal cancer.   POSTOPERATIVE DIAGNOSIS:  Rectal cancer: 6 cm to 10 cm from anal verge,  located on right anterior rectum occupying 40% lumen of bowel, invades  to a depth of 1.1 cm just into the perirectal fat, no obvious  adenopathy,  uT3 uN0.   PROCEDURE:  Rigid sigmoidoscopy, rectal ultrasound.   SURGEON:  Sandria Bales. Ezzard Standing, M.D.   INDICATIONS FOR PROCEDURE:  The patient is a 65 year old white male  patient of Tally Joe, M.D., who had a colonoscopy by Graylin Shiver,  M.D. and was found to have a rectal carcinoma.  He saw Ardeth Sportsman,  M.D., my partner, who asked me to see him for rectal ultrasound for  documentation of depth of tumor for both staging purposes and further  treatment planning.   DESCRIPTION OF PROCEDURE:  The patient completed a bowel prep at home  and presented to [facility] operating room.  He was  placed in the left lateral decubitus position and given a MAC  anesthesia.  I did a rigid sigmoidoscopy to about 15 cm.   By rigid sigmoidoscopy, the top of the tumor was about 10 cm from the  anal verge, the bottom of the tumor about 6 cm from the anal verge, and  was on the right anterior rectal wall.  The bottom of the tumor is  palpable on digital examination, but I cannot feel the top of it by  digital examination.   The rectal ultrasound is a 10 MHz ultrasound probe and by ultrasound the  top of the tumor was at about 11 cm from anal verge, the bottom of the  tumor about 7 cm from the anal verge.  It occupies 40% of the lumen of  the  bowel, goes to a depth of approximately 1.1 cm.  It does appear to  invade through the muscularis propria into the perirectal fat, so this  will be a uT3 tumor.  I saw no nodes, so it will be a uN0 tumor.   The patient tolerated the procedure well and was transferred back to Day  Surgery to be discharged home today.  I reviewed the findings with Dr.  Michaell Cowing.  He will return to see Dr. Michaell Cowing in 4-6 weeks.  He has already  seen Lauretta I. Odogwu, M.D. for discussion of neoadjuvant therapy.      Sandria Bales. Ezzard Standing, M.D.  Electronically Signed     DHN/MEDQ  D:  10/09/2007  T:  10/09/2007  Job:  [id number]   cc:   Tally Joe, M.D.  Fax: [phone]   Graylin Shiver, M.D.  Fax: [phone]   Lauretta I.  Odogwu, M.D.  Fax: [phone]   Ardeth Sportsman, MD  [address] [place] [address]

## 2010-11-24 NOTE — Discharge Summary (Signed)
Eugene [name], Eugene [name]               ACCOUNT NO.:  [id number]   MEDICAL RECORD NO.:  [id number]          PATIENT TYPE:  INP   LOCATION:  5154                         FACILITY:  [facility]   PHYSICIAN:  Kela Millin, M.D.DATE OF BIRTH:  1945-08-27   DATE OF ADMISSION:  08/23/2008  DATE OF DISCHARGE:  08/27/2008                               DISCHARGE SUMMARY   DISCHARGE DIAGNOSES:  1. Acute on chronic renal failure.  2. Chronic kidney disease.  3. Hypokalemia - potassium repleted.  4. Diabetes mellitus.  5. Metabolic acidosis - secondary to #1, resolved.  6. History of rectal abscess - to follow up at [facility].  7. Mild hypotension - on admission, secondary to hypovolemia,      resolved.  8. Hyponatremia - secondary to volume depletion, resolved.  9. History of rectal cancer.  10.Anemia - secondary to #1, hemoglobin stable at 9.1 upon discharge.   PROCEDURES AND STUDIES:  Renal ultrasound - the kidneys and bladder  appear normal.  Tiny amount of perinephric fluid on the left is  nonspecific, question inflammation.   CONSULTATIONS:  Nephrology - Dr. Arlean Hopping   BRIEF HISTORY:  The patient is a pleasant 65 year old white male with  the above-listed medical problems who presented with worsening fatigue,  nausea and decreased p.o. intake.  He reported that he had been treated  with doxycycline for 10 days for a skin abscess per his dermatologist  and he developed nausea, dry heaves and continued to have those symptoms  for about 3 weeks with decreased p.o. intake.  He also reported he had  been seen at [facility] 3 days prior to his admission and was started  on Cipro for a rectal abscess.  It was noted that he was on metformin  for his diabetes.  Upon admission, his BUN was elevated at 139 with a  creatinine of 6.15.  His last creatinine back in July of 2009 was 1.98.  He was admitted for further evaluation and management.   Please see the see the full dictated admission history  and physical for  the details of the admission physical exam as well as the laboratory  data.   HOSPITAL COURSE:  Problem #1.  Acute on chronic renal failure - upon  admission the impression was that this was secondary to volume depletion  and that the Cipro 500 b.i.d. as well as his metformin that he had been  on were contributing factors in this setting.  He was started on IV  fluids for hydration and nephrology was consulted.  A renal ultrasound  was done and the results as stated above.  Nephrology refused the  ultrasound and indicated that it was unremarkable.  The patient was  acidotic with a CO2 of 14 on admission and bicarb was added to his IV  fluids.  A urinalysis was done and it was unremarkable.  Dr.  Schertz/Nephrology saw the patient and SPEP was done.  No M-spike  detected.  UPEP was also done and the total protein was 302.  Free kappa  light chain 7.25, free lambda light chains 4.97.  Dr. Arlean Hopping  recommended for the patient to follow up at the [facility] office  after discharge for further management of his chronic kidney disease.  Once his acidosis resolved, the bicarb was discontinued from his fluids,  IV for hydration was continued and his creatinine has improved to 2.76  today prior to discharge.  His CO2 is 21.  He is tolerating p.o. well.  No nausea or vomiting.  The Cipro was renally dosed and he is to  continue Cipro once daily and follow up at [facility] as directed.  His  metformin was also held during this hospital stay.  Problem #2.  Hypokalemia - his potassium was replaced in the hospital.  Problem #3.  Hyponatremia - secondary to volume depletion, resolved with  hydration.  Problem #4.  Diabetes mellitus - his metformin was held during this  hospital stay secondary to #1.  His Accu-Cheks were monitored and he was  covered with insulin.  His creatinine is markedly improved but still  elevated at 2.76, therefore, I have discontinued his metformin.  I  have  started him on Amaryl instead, he is to keep a log of his blood sugars,  follow up with his primary care physician to have his dose adjusted for  optimal blood glucose control.  Problem #5.  Rectal abscess - as above.  The patient to continue Cipro  once daily as directed.  Problem #6.  Anemia - the impression was that this was secondary to  chronic kidney disease.  He did not have any evidence of GI bleeding  during this hospital stay.  Problem #7.  History of blindness - secondary to retinitis pigmentosa.   DISCHARGE MEDICATIONS:  1. Amaryl 2 mg p.o. daily.  2. Cipro 500 mg once daily as directed.  3. Crestor 10 mg once daily.  4. Fish oil 1000 mg p.o. daily.   Patient instructed to discontinue metformin as above.   FOLLOW-UP CARE:  1. Dr. Tally Joe in 1-2 weeks, the patient is to take log of his      blood sugars to his appointment.  Her blood sugars monitored as      well as BUN and creatinine rechecked on follow-up visit.  2. [facility] as directed.  3. [facility] as scheduled.   DISCHARGE CONDITION:  Improved/stable.      Kela Millin, M.D.  Electronically Signed     ACV/MEDQ  D:  08/27/2008  T:  08/27/2008  Job:  [id number]   cc:   Tally Joe, M.D.  [facility]

## 2010-11-24 NOTE — Discharge Summary (Signed)
NAME:  Eugene [name], SILBER NO.:  [id number]   MEDICAL RECORD NO.:  [id number]          PATIENT TYPE:  INP   LOCATION:  1525                         FACILITY:  [facility]   PHYSICIAN:  Ardeth Sportsman, MD     DATE OF BIRTH:  [birth year]   DATE OF ADMISSION:  01/18/2008  DATE OF DISCHARGE:  01/23/2008                               DISCHARGE SUMMARY   PRIMARY CARE PHYSICIAN:  Tally Joe, M.D.   GASTROENTEROLOGIST:  Graylin Shiver, M.D.   MEDICAL ONCOLOGIST:  Vicente Serene I. Odogwu, M.D.   RADIATION ONCOLOGIST:  Billie Lade, M.D.   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   OTHER DIAGNOSES:  1. Legal blindness.  2. Morbid obesity.  3. Diabetes.  4. Hypertension.   PRINCIPLE PROCEDURE PERFORMED:  Laparoscopically assisted low anterior  resection with hand sewn coloanal anastomosis and diverting loop  ileostomy on January 18, 2008.   SUMMARY OF HOSPITAL COURSE:  This is a 65 year old obese male who had a  very low rectal cancer that underwent neoadjuvant chemoradiation  therapy.  He was initially staged as T3N0 by transrectal ultrasound.  He  seemed to have a good clinical response.  After discussing with the  patient and his family he underwent the above resection.  There was a  staple misfire so it had to be hand sewn at the coloanal region and  because it was a very low region with preoperative chemoradiation  therapy I did a proximal diversion to protect this low anastomosis.   Postoperatively he did have some oozing and ended up getting transfused  2 units intraoperatively and then two units later on postop day #1.  His  hemoglobin drifted down into the 8's but remained stable.  He was placed  on the Entereg protocol.  He began to have good output out of his  ileostomy by postoperative day #2.  He was started on liquids and by the  time of discharge was  tolerating a solid diet with occasional p.r.n.  Imodium averaging about two to four doses a day.   He did spike fevers up to as high as 102 and the pelvic drainage became  more brownish.  He did have some rectal drainage, but his anastomosis  was intact and viable.  Given the possibility of a small anastomotic  leak and/or fluid collection abscess or hematoma, we placed him on some  IV antibiotics.  He defervesced.  He never really had any leukocytosis  or any severe debilitating abdominal pain.   He did have difficulty with urinary retention and failed having his  Foley catheter removed initially, having had to have it replaced.  Normally he has not had those issues but it is probably related to the  retraction on his bladder in order to the pelvic dissections since he  had a very narrow, deep, and anteverted pelvis.   By the time of discharge he was walking the hallways well.  He had  adequate pain control with oral medications.  He  was tolerating solid  diet and had decent urine output.  He had bumped his creatinine as high  as in the low 2's but it was starting to come down into the 1's and  seemed to be stabilizing.   Based on these Improvement's, I feel it is reasonable to discharge home  with the following instructions:   1. He is to return to clinic next week for evaluation of his incisions      and his drains and at that point maybe consider having his drain      removed if output has cleared up and he is doing well.  2. He should continue antibiotics, ciprofloxacin 500 mg p.o. b.i.d.      and Flagyl 500 mm p.o. q.i.d. for at least the next 7 days, to help      clear any pelvic infection and decrease the chance of a more      significant abscess.  If has spikes of fever or worsening pain      again he should get a CT scan of the abdomen and pelvis to rule out      intra-abdominal abscess.  3. He can go home with Phenergan 25 mg p.o. or Reglan 10 mg p.o. q.6h.      p.r.n.  nausea.  He should avoid over using the Reglan as it may      make his ileostomy effluent output worse.  4. He should eat a solid diet.  He should take some source of fiber at      least twice a day.  He hated the Metamucil but was interested in      trying Benefiber.  I told him that was reasonable.  He also should      take Imodium 2-4 mg p.o. q.6h. p.r.n. severe diarrhea.  I stressed      the importance of have him drinking plenty of fluids so he avoids      getting dehydrated as he did not have his colon to help increase      digestive tract fluid losses.  He and his family seem very aware of      this and competent to manage this.  5. He should manage his ileostomy as instructed by the wound care      service and will have home health see him at least one time to make      sure they feel comfortable with management of the wafers and      emptying.  He understands at some point we will take his ileostomy      down depending on results of pathology and recommendations of Drs.      Odogwu and Kinard, for medication radiation oncology if any post      adjuvant treatment is warranted.   ADDENDUM:  His pathology did come back and it seemed to have a complete  pathological response.  There was no residual tumor in the scar  remaining.  They were only able to find nine lymph nodes despite a high  inferior mesenteric artery and vein ligation.  I will discuss pathology  to make sure that is all they found.  I do feel confident that I got  good distal margins as the lesion was at around 7 cm from the anal verge  by rigid proctoscopy and anastomosis was at 3 cm from the anal verge.  There was some scarring and issues with the stapler but I was able to  get  a new distal margin when I did the hand sewn anastomosis.  I feel  like we got good margins.  Pathologically the best margin seems to be  1.4 cm according to them but I would put it more grossly of at least  over 2 cm.  I will discuss with  pathology to make sure we are on the  same age.      Ardeth Sportsman, MD  Electronically Signed     SCG/MEDQ  D:  01/24/2008  T:  01/24/2008  Job:  [id number]   cc:   Tally Joe, M.D.  Fax: [phone]   Graylin Shiver, M.D.  Fax: [phone]   Billie Lade, M.D.  Fax: [phone]   Lauretta I. Odogwu, M.D.  Fax: [phone]

## 2010-11-24 NOTE — Op Note (Signed)
NAME:  KEAUN, SCHNABEL NO.:  [id number]   MEDICAL RECORD NO.:  [id number]          PATIENT TYPE:  INP   LOCATION:  0001                         FACILITY:  [facility]   PHYSICIAN:  Ardeth Sportsman, MD     DATE OF BIRTH:  July 24, 1945   DATE OF PROCEDURE:  01/18/2008  DATE OF DISCHARGE:                               OPERATIVE REPORT   PRIMARY CARE PHYSICIAN:  Tally Joe, M.D.   GASTROENTEROLOGIST:  Graylin Shiver, M.D.  with [facility].   MEDICAL ONCOLOGIST:  Vicente Serene I. Odogwu, M.D.   RADIATION ONCOLOGIST:  Billie Lade, M.D.   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   SURGEON:  Ardeth Sportsman, MD   ASSISTANT:  Adolph Pollack, M.D. through most of the case, and we  would aim for the latter part of the case.   PROCEDURES:  1. Laparoscopic mobilization of the splenic flexure of colon.  2. Laparoscopically assisted low-anterior resection.  3. Hand sewn coloanal anastomosis.  4. Diverting loop ileostomy.  5. Surgical proctoscopy.   ANESTHESIA:  1. General.  2. Local and a field block around the port sites.  3. Anorectal block.  4. On-Q pain pump.   DRAINS:  1. A 19-French Blake drain in the right lower quadrant goes down      towards the pelvis.  2. A 19-French black drain in the left lower quadrant goes from the      pelvis up the left pericolic gutter.   ESTIMATED BLOOD LOSS:  600 mL.   COMPLICATIONS:  Staple failure, resulting in the need for handsewn  anastomosis, but otherwise no major complications.   INDICATIONS:  Mr. Duval is an obese, 65 year old male who is found to  have a rectal ulcerated polyp on screening colonoscopy about 7 cm from  the anal verge.  I underwent evaluation and I could palpate it with my  finger.  The patient has concerns that I  recommended that he get  neoadjuvant chemoradiation therapy.  He has completed that, and a  recommendation was made for segmental resection by low anterior  resection.  Because it is a low anastomosis with radiation, and his  morbid obesity as well as diabetes, recommendation was made for a  diverting loop ileostomy.  The risks, benefits, and alternatives were  discussed in detail.  Questions were answered and he agreed to proceed.   OPERATIVE FINDINGS:  He had an ulcerated rectal mass about 7 cm from the  anal verge around 9-11 o'clock in the lithotomy position.  It was  mobile.  He had a very narrow pelvis, and a very fatty mesorectum.  There was no evidence of any metastatic disease.  I was able to get good  splenic flexure mobilization.   DESCRIPTION OF PROCEDURE:  Informed consent was confirmed.  The patient  received a bowel prep just prior to surgery given to the left side of  the colon.  He had a sequential  pressure device active throughout the  entire case.  Obvious cefoxitin given just prior to surgery.  He had  oral enteric given just prior to surgery. He underwent general  anesthesia administration without any difficulty.  He was position in  low-lithotomy position with arms tucked.   Attention was turned towards a rigid proctoscopy  Digital examination  was done; and, again, I could palpate the mass.  A rigid proctoscopy  confirmed that the mass was about 7 cm from the anal verge at the 9-11  o'clock position in the lithotomy position.  I went in and marked the  area, marked around the distal, with some [place] ink.   His abdomen was prepped and draped in a sterile fashion.  A 5 mm port  was placed in the right upper quadrant using optical entry technique  with the patient in reverse Trendelenburg and right side up.  Camera  revealed no intra-abdominal adhesions, and no intra-abdominal injury.  Under visualization 5-mm ports were placed supraumbilically; in the  right  lower quadrant, in the suprapubic region, and in the left lower  quadrant.   Attention was turned toward the diagnostic laparoscopy.  There was no  obvious evidence of any metastatic disease.  He had a redundant,  tortuous sigmoid colon.  I had to free the terminal ileum from its level  of attachments to help get the small bowel out of the pelvis.  The  mesentery of the colon from the proximal rectum along the sigmoid colon  all the way up towards the ligament of Treitz is freed off using a  cautery.  I was able to get to a window to get posterior, to help  elevate the sigmoid mesentery anteriorly. I got into the retroperitoneum  in the retromesenteric plane.  In getting there I saw a tubular  structure that looked like the classic ureter, but it would not do any  vermiform peristalsis for me.  I did further mobilization down to help  mobilize the proximal posterior rectum to get down into the avascular  retrorectal plane.  There was a little too much tension, and I was not  able to definitely see to my satisfaction, and he had a thickened  mesentery that I decided to convert over to lateral-to-medial approach.   Therefore, the sigmoid colon was held on tension towards the right upper  quadrant and I was able to free it from its lateral attachments at the  line of Toldt.  We were able to free it off and carry this up to the  descending colon, all the way up to the splenic flexure. In doing this I  could help roll the colon over and help free it off its avascular plane.  He had a very thick retroperitoneum which, I think, made it a little bit  confusing coming from the medial and lateral probe initially, but it  came across pretty well.  Thus the left ureter was seen which was the  tubular structure that I had seen before.  It was following the abdomen  and seemed to be going up towards the kidney.  It was kept  retroperitoneal.  I definitely felt, that I only got a spot in the   mesentery only.   The patient was place with more head up and right side down, and I was  able to help free the splenic flexure of the colon off of its  attachments retroperitoneal, attachment which was very fortunately not  densely adherent to  the spleen.  The splenic flexure of the colon up to  the proximal transverse colon were freed off its attachments to the  greater omentum as well as its retroperitoneal attachments near the  inferior pancreatic ridge.  In doing this I got excellent mobilization  of the colon and could get the midtransverse colon to come down towards  the pelvis very well.  Further lateral mobilization was done until it  was all the way to the midline.   Attention was turned towards the pelvic dissection.  The sigmoid and  rectum were placed on axial tension, and we were able to help him get  some posterior mobilization.  We tried to free the lateral stalks on  right and left doing copious using rectal incision.   However, over time his mesorectum was very dilated and the pelvis was  very narrow.  It was very difficult to get good visualization.  Therefore, I went ahead and placed a HandPort through a suprapubic  midline incision, about a 7-cm incision.  I was able to place my hand  in.  In doing that, I could use blunt dissection to get posterior  dissection all the way down to the levators.  Further mobilization was  done to free off the lateral stalks as well using some blunt as well as  focused ligature mobilization.  Sharp dissection was done to free the  peritoneal reflection anteriorly and was able to free off attachments  without the HandPort and some sharp dissection and blunt dissection.  Further dissection was done until I felt like I was getting close down  to the levator circumferentially.  I placed a finger in the anus and I  could feel the rectal mass.  I could feel that I was at least 3 cm  distal to the dissection and getting close to the anal  verge, by getting  close to the levator circumferentially.  I felt like we had adequate  mobilization.   I, therefore, tried to use a contour stapler to get down into the deep  pelvis; however, his pelvis was very narrow and very retroverted so it  was difficult to get a contour staple down to the distal rectum.  I,  therefore, made a decision to transect the colon at the mid rectum, and  was able to get a posterior mesenteric window and transected the colon  to the mid rectum and take the meso rectum using high-dose cautery to  good result.  That helped clear out some of the rectal contents.  We  packed the proximal external colon back up into the abdomen.   We got another load of the contour and was able to get further down.  I  felt like I could get the stapler clamped to just at the level of the  mass and they required 2 firings, but ultimately I was able to free out  the rest of the meso rectum.  We did open up this middle rectum specimen  and it came down to the tattoo specimen, and I believe that I got some  cancer, but I was not confident that I had gotten distal to it.  The  remaining rectal stump had its mesorectum off and could easily be felt.  I could easily get a contour stapler distal to that, and I was  definitely down into normal distal rectum.  This was clamped and fired  off.  The proximal end seems to come intact.  We opened that up,  and  definitely had rectum with over 1 cm distal margin grossly.   Copious irrigation done, hemostasis was assured.  I went ahead and  laparoscopically did high vessel ligation of the inferior mesenteric  artery and the anterior mesenteric vein taking this off just to the  mesentery only, and keeping the left ureter out of harms way.  His  proximal sigmoid mesentery was somewhat inflamed and thickened, but I  was able to skeletonize it and do a ligation down there, and got high  vessel ligation.  With that I got excellent evisceration of  the colon.  I picked a location at the descending/sigmoid colon that easily  stretched down into the pelvis and this had a gentle bowel clamp  proximally and a Kocher distally.  Window was made in the meso rectum.  I took the sigmoid and descending mesentery in a ray-like fashion from  the I&V-ligation up to the colon trying to preserve some descending  colon mesentery with a good swathe.  I transected the colon at the  descending/sigmoid junction with the scalpel.  The specimen was sent  off. EEA-scissor done and a 33 EEA-scissor easily felt in.  Through an  EEA stapler I placed a #0 Prolene, purse string stitch.  I put the  anvil, tied it down and we seemed to have a good snug result.  I did a  little bit of skeletonization of some large fatty epiploica appendages  off.  I tried not to peel off the mesentery.  Anvil was placed down.   Dr. Abbey Chatters went down into the pelvis.  He did some gentle finger  dilation and placed the stapler up the rectal stump.  When I looked down  the stapler had completely gone through the rectal stump.  He pulled the  staple back.  Despite using long malleables and retractors I could not  see the rectal stump adequately as it was too distal to see under his  thickened narrow, distal, long pelvis.  At this point, we decided that  the best thing to do would be to try and do a handsewn coloanal  anastomosis since it is so distal.   At this point, Dr. Abbey Chatters had to scrub out, and Dr. Claud Kelp  came in.  There was some bleeding down the pelvis, but this was  controlled with packs and irrigation.  I went down below and was able to  easily feel up to the rectal stump.  It was about 3 cm from the anal  verge.  I was able to mobilize the open rectal stump and it eviscerated  somewhat.  Dr. Derrell Lolling was able to pass the descending colon with its  anvil down into the pelvis, and I can bring that out, actually through  the anus.  A [place] retractor was  used to help better expose the  proximal rectum.  I ended up releasing the pursestring and removing the  anvil, and did a handsewn anastomosis using 3-0 PDS interrupted  stitches.  I first did stitches in 4 quadrants at 10 o'clock, 2 o'clock,  8 o'clock, and 4 o'clock in the lithotomy positions.  These tails were  held under tension to help keep the anastomosis in view.  I did a series  of interrupted stitches to help bridge the gaps.  Meticulous inspection  was done.  There was good viability of the rectal stump as well as the  descending colon.  I went ahead and trimmed a little bit  more rectal  stump on the right and anterior side to make sure that I had an adequate  distal margin; but, I did feel like I was doing handsewing around 3 cm  from the anal verge.  I felt like I was definitely distal to the mass in  question.  Once the anastomosis was assured, I could do digital  examination on a nice wide, patent open anastomosis, that was viable.  The stitch was released and the anastomosis went back up.  I went in and  placed an anorectal block with a total of 20 mL of bupivacaine 1/4%.  I  ended up packing the area with the Gelfoam wrapped around some Surgicel  just for some endorectal hemostasis.   I rescrubbed, and Dr. Derrell Lolling did some inspection.  There was a little  blood down into the pelvis, and then copious irrigation was done with  clear return.  I inspected the rest of the quadrants.  I went ahead and  placed the drains as noted above. I went ahead and found the terminal  ileum and confirmed that it was going into the cecum.  It measured about  20 cm proximal to this.  The patient had been marked preoperatively for  an ileostomy site in the right upper quadrant.  I ended up using Kocher  at the ileostomy site, and this was where the right upper quadrant port  site was.  I used a scalpel to get a circular incision.  I was able to  get to the anterior rectus fascia and split that  in a longitudinal  fashion and spread between the rectus muscles and over the posterior  rectus fascia and peritoneum.  It was dilated to 2 fingers.  I was able  to bring the loop of ileum 20 cm proximal to the ileocecal junction out.  The inferior end is distal, the proximal end is superior.  That easily  came up.  I went ahead and matured the ileostomy using 2-0 Vicryl interrupted  stitches for good result.  Finger inspection revealed both proximal and  distal limbs were patent and not obstructed at the fascia.  This was  covered.   I went ahead and changed gloves.  I ended up placing the On-Q catheters  in the preperitoneal plane under digital palpation, several centimeters  lateral to the midline suprapubic incision.  This incision was closed  using #1 looped PDS and copious irrigation was done.  There was closure  of the skin using some interrupted 4-0 Monocryl stitch.  The  infraumbilical port site was closed using 4-0 Monocryl stitch.  The  other port sites have been used for the drains and they were secured  using nylon stitches.  The On-Q pain catheters were fed through the  sheaths and the sheaths were removed.  A catheter was secured to the  skin using Steri-Strips and Tegaderm.  Sterile dressings were applied.  The patient was extubated and sent to the recovery room in stable  condition.   Because this was a long case, and above average blood loss, we decided  to give him some blood since an i-STAT showed a hemoglobin of less than  8.  We will watch him in the step-down unit since the case was a little  bit longer than anticipated.      Ardeth Sportsman, MD  Electronically Signed     SCG/MEDQ  D:  01/18/2008  T:  01/18/2008  Job:  [id number]   cc:  Tally Joe, M.D.  Fax: [phone]   Graylin Shiver, M.D.  Fax: [phone]   Lauretta I. Odogwu, M.D.  Fax: [phone]   Billie Lade, M.D.  Fax: [phone]

## 2010-11-24 NOTE — H&P (Signed)
NAMESAFI, CULOTTA               ACCOUNT NO.:  [id number]   MEDICAL RECORD NO.:  [id number]          PATIENT TYPE:  INP   LOCATION:  1827                         FACILITY:  [facility]   PHYSICIAN:  Kela Millin, M.D.DATE OF BIRTH:  09/01/1945   DATE OF ADMISSION:  08/23/2008  DATE OF DISCHARGE:                              HISTORY & PHYSICAL   PRIMARY CARE PHYSICIAN:  Tally Joe, M.D.   CHIEF COMPLAINT:  Worsening fatigability, with nausea and decreased p.o.  intake.   HISTORY OF PRESENT ILLNESS:  The patient is a 65 year old white male  with past medical history significant for diabetes mellitus, rectal  adenocarcinoma and status post preop neoadjuvant chemotherapy and  surgical resection with ileostomy July of 2009 and renal insufficiency  whose last creatinine prior to discharge in July of 2009 was 1.98,  diabetes mellitus, legal blindness and hypertension who presents with  above complaints.  He states that his symptoms - feeling tired and  decreased energy levels started July 16, 2008, after he had been kept  n.p.o. for lab tests and x-rays that he was to have done at [facility].  Since then he reports that his symptoms have continued to worsen and  that in the interim he was diagnosed by his dermatologist with a skin  abscess and treated with doxycycline for 10 days and while on this  antibiotic he developed nausea and dry heaves.  He completed the  doxycycline course some time ago but this nausea has persisted and for  the past 3 weeks he has had decreased p.o. intake.  He denies any change  in his ileostomy output.  He also denies dysuria, fevers, melena and no  hematochezia.  He also reports that he followed up at [facility] about  3 weeks ago and was started on Cipro for a rectal abscess.  He also  reports that he has had a drain placed in that rectal abscess.  He  denies pleuritic chest pain, shortness of breath, focal weakness,  dizziness and no pruritus.  He  also denies cough, fevers and no leg  swelling.  He states that his generalized weakness has worsened to the  point where in the past few days he has had difficulty walking, feels  tremulous.   He went to see his primary care physician on the day prior to admission  and lab work was done and he was called this morning and told that his  creatinine was elevated and he needed to come back to be rechecked.  He  had this rechecked and was asked to come to the ED.  In the ED his  creatinine was found to be elevated at 6.15 with a BUN of 139 and sodium  of 126.  His potassium was normal at 3.8.  A chest x-ray was done and it  showed no active disease.  Nephrology was consulted per the ED and he  was admitted for further evaluation and management.   PAST MEDICAL HISTORY:  As above, history of legal blindness.   MEDICATIONS:  1. Metformin 500 mg two p.o. q.a.m. and one  p.o. q.p.m.  2. Crestor 10 mg p.o. daily.  3. Fish oil 1200 mg daily.  4. Imodium occasionally.  5. The patient states that he was on Diovan HCT but this was      discontinued in July of 2009.  6. Cipro 500 mg one p.o. b.i.d., started Tuesday February 9.   ALLERGIES/INTOLERANCES:  DILAUDID, REGLAN.   SOCIAL HISTORY:  He quit tobacco 33 years ago, he denies alcohol.   FAMILY HISTORY:  His dad had coronary artery disease, negative for  kidney disease.   REVIEW OF SYSTEMS:  As per HPI, other review of systems negative.   PHYSICAL EXAM:  GENERAL:  The patient is an older white male, does not  make any eye contact (legally blind), in no apparent distress.  VITAL SIGNS:  Temperature is 97.4, blood pressure 97/54, initially  107/62, pulse is 90, initially 103, respiratory rate is 18, O2 sat of  100%.  HEENT:  Normocephalic, atraumatic.  Blind as above.  Dry mucous  membranes.  No oral exudates.  NECK:  Supple.  No adenopathy, no thyromegaly, no JVD.  LUNGS:  Clear to auscultation bilaterally.  No crackles, no wheezes.   CARDIOVASCULAR:  Regular rate and rhythm.  Normal S1-S2, no rubs or  gallops.  ABDOMEN:  Ileostomy present on right side of upper abdomen with dark  liquid mixed with solid stool.  No surrounding induration or drainage.  Positive bowel sounds, nontender and nondistended.  RECTUM:  He has a drain coming out from his rectum from the abscess  inside, the drain is anchored to his right buttock with a dressing.  Some erythema noted around the dressing but no skin breakdown, also some  erythema on the left mid buttock area but no skin breakdown.  There is  whitish liquid drainage in the bulb.  EXTREMITIES:  No cyanosis and no edema.  NEURO:  He is alert and oriented x3, nonfocal.   LABORATORY DATA:  White cell count is 15.6 with a hemoglobin of 12.6,  hematocrit of 36.6, platelet count 203, neutrophil count 85%.  Sodium is  126 with a potassium of 3.8, chloride 92, CO2 of 14, glucose 240, BUN  139, creatinine of 6.15, calcium 9.5, total protein is 7.3, albumin is  3.6, AST is 38.  Chest x-ray - no disease.   ASSESSMENT AND PLAN:  1. Acute on chronic renal failure - as discussed above his last      creatinine per echart July of 2009 1.98.  Multifactorial etiology -      decreased p.o. intake times 3 weeks and mildly hypotensive in the      ED and has also been on metformin and Cipro.  Will obtain a      urinalysis with cultures and sensitivity, also place a Foley      catheter and obtain renal ultrasound.  Will hold metformin and      renally dose Cipro.  Will hydrate, follow recheck, nephrology      consulted per ED.  He is acidotic as above but not hyperkalemic and      no signs of pericarditis.  2. Metabolic acidosis - secondary to #1, will add bicarb to fluids,      follow recheck.  3. Hyponatremia - likely secondary to volume depletion, hydrate,      follow recheck and further evaluate as appropriate if not      improving.  4. Volume depletion - as above, hydrate, follow and  recheck.  5. Uncontrolled  diabetes mellitus - monitor Accu-Cheks, holding      metformin for now secondary to #1, follow.  6. Known rectal abscess - drain in place, as above renally dose      antibiotics.  The patient is followed at [facility].  7. Mild hypotension - as discussed above, hydrate, follow and recheck.  8. History of rectal cancer - status post surgery with ileostomy and      new adjuvant chemo in the past.  9. History of hypercholesterolemia - continue Crestor.  10.History of hypertension - as noted above has been off      antihypertensives since July of 2009, follow.      Kela Millin, M.D.  Electronically Signed     ACV/MEDQ  D:  08/23/2008  T:  08/23/2008  Job:  [id number]   cc:   Tally Joe, M.D.

## 2010-11-27 NOTE — Op Note (Signed)
Eugene [name], Eugene [name]               ACCOUNT NO.:  [id number]   MEDICAL RECORD NO.:  [id number]          PATIENT TYPE:  AMB   LOCATION:  DSC                          FACILITY:  [facility]   PHYSICIAN:  Katy Fitch. Sypher, M.D. DATE OF BIRTH:  [birth year]   DATE OF PROCEDURE:  04/28/2006  DATE OF DISCHARGE:                                 OPERATIVE REPORT   PREOPERATIVE DIAGNOSIS:  Mass volar aspect of right elbow antecubital fossa  consistent with atypical lipoma.   POSTOPERATIVE DIAGNOSIS:  Mass volar aspect of right elbow antecubital fossa  consistent with atypical lipoma.   OPERATION:  Excision of lipomatous mass antecubital fossa right elbow.   OPERATING SURGEON:  Josephine Igo, MD   ASSISTANT:  Annye Rusk PA-C.   ANESTHESIA:  General by LMA.   SUPERVISING ANESTHESIOLOGIST:  Dr. Krista Blue.   INDICATIONS:  Eugene [name] is a 65 year old gentleman who is referred for  evaluation and management of a mass in his right antecubital fossa.  Clinical examination suggested a clinical lipoma.  Given his age of 65, we  recommended obtaining an MRI to be certain that this was not some type of  atypical mass.   An MRI with contrast was obtained and had the features of an unusual lipoma.   Given his age and the rapid onset of this predicament, we elected to proceed  with excisional biopsy for diagnosis.   After informed consent, Eugene [name] is brought to the operating room at this  time.   PROCEDURE:  Eugene [name] is brought to the operating room and placed in  supine position upon the operating table.   Following the induction of general anesthesia by LMA technique, the right  arm is prepped with Betadine soap solution and sterilely draped.  A  pneumatic tourniquet was applied to the proximal right brachium.   On exsanguination of the limb with Esmarch bandage, arterial tourniquet was  inflated to 220 mmHg.  Procedure commenced with a 3 cm longitudinal incision  centered directly over  the mass.  Subcutaneous tissues were carefully  divided taking care to gently retract the cutaneous sensory nerves.  Normal  fat was encountered in the subcutaneous space; however, about 1 cm below the  superficial fascia, an unusual-appearing pale, kind of yellowish, cream-  colored mass was identified.  This did not have the typical vascular pattern  one would see in fat and appeared to be relatively avascular compared to  normal-appearing fat.   This was friable and circumferentially dissected.  There were no  telangiectasias or signs of a malignant blood supply.   This was circumferentially dissected to the deep fascia at the antecubital  fossa adjacent to the biceps tendon.   This was completely resected with a margin of about 5 mm of normal tissue.   The specimen was passed off in formalin for pathologic evaluation.  The  wound was inspected for bleeding points which were electrocauterized with  bipolar current followed by repair of the skin with intradermal 0 Prolene  and Steri-Strips.  There are no apparent complications.   Eugene [name] tolerated surgery and anesthesia  well.  He is transferred to  recovery with stable vital signs.   He will be discharged to care of his wife with a prescription for Percocet 5  mg 1 p.o. q.4-6h. pain.  It should be noted that his preoperative blood  glucose was 312, and a follow up passing close was 172.  Eugene [name] appears  to have new onset diabetes and has been referred back to his physician for  further evaluation and management.      Katy Fitch Sypher, M.D.  Electronically Signed     RVS/MEDQ  D:  04/28/2006  T:  04/29/2006  Job:  [id number]

## 2011-01-01 ENCOUNTER — Other Ambulatory Visit: Payer: Self-pay | Admitting: Hematology and Oncology

## 2011-01-01 ENCOUNTER — Ambulatory Visit (HOSPITAL_COMMUNITY)
Admission: RE | Admit: 2011-01-01 | Discharge: 2011-01-01 | Disposition: A | Discharge status: Home / Self Care | Payer: Medicare Other | Source: Ambulatory Visit | Attending: Hematology and Oncology | Admitting: Hematology and Oncology

## 2011-01-01 ENCOUNTER — Other Ambulatory Visit (HOSPITAL_COMMUNITY): Payer: Self-pay

## 2011-01-01 ENCOUNTER — Encounter (HOSPITAL_BASED_OUTPATIENT_CLINIC_OR_DEPARTMENT_OTHER): Payer: Medicare Other | Admitting: Hematology and Oncology

## 2011-01-01 ENCOUNTER — Encounter (HOSPITAL_COMMUNITY): Payer: Self-pay

## 2011-01-01 DIAGNOSIS — E041 Nontoxic single thyroid nodule: Secondary | ICD-10-CM | POA: Insufficient documentation

## 2011-01-01 DIAGNOSIS — I2510 Atherosclerotic heart disease of native coronary artery without angina pectoris: Secondary | ICD-10-CM | POA: Insufficient documentation

## 2011-01-01 DIAGNOSIS — C20 Malignant neoplasm of rectum: Secondary | ICD-10-CM

## 2011-01-01 DIAGNOSIS — M47814 Spondylosis without myelopathy or radiculopathy, thoracic region: Secondary | ICD-10-CM | POA: Insufficient documentation

## 2011-01-01 HISTORY — DX: Malignant (primary) neoplasm, unspecified: C80.1

## 2011-01-01 LAB — CBC WITH DIFFERENTIAL/PLATELET
Basophils Absolute: 0 10*3/uL (ref 0.0–0.1)
EOS%: 4.1 % (ref 0.0–7.0)
Eosinophils Absolute: 0.3 10*3/uL (ref 0.0–0.5)
HCT: 31.6 % — ABNORMAL LOW (ref 38.4–49.9)
HGB: 10.8 g/dL — ABNORMAL LOW (ref 13.0–17.1)
MONO#: 0.5 10*3/uL (ref 0.1–0.9)
NEUT#: 6.9 10*3/uL — ABNORMAL HIGH (ref 1.5–6.5)
NEUT%: 81.9 % — ABNORMAL HIGH (ref 39.0–75.0)
RDW: 15.4 % — ABNORMAL HIGH (ref 11.0–14.6)
WBC: 8.4 10*3/uL (ref 4.0–10.3)
lymph#: 0.6 10*3/uL — ABNORMAL LOW (ref 0.9–3.3)

## 2011-01-01 LAB — CMP (CANCER CENTER ONLY)
AST: 25 U/L (ref 11–38)
Albumin: 3.6 g/dL (ref 3.3–5.5)
BUN, Bld: 37 mg/dL — ABNORMAL HIGH (ref 7–22)
CO2: 31 mEq/L (ref 18–33)
Calcium: 9.2 mg/dL (ref 8.0–10.3)
Chloride: 96 mEq/L — ABNORMAL LOW (ref 98–108)
Creat: 2.7 mg/dl — ABNORMAL HIGH (ref 0.6–1.2)
Glucose, Bld: 118 mg/dL (ref 73–118)
Potassium: 4.5 mEq/L (ref 3.3–4.7)

## 2011-01-01 LAB — CEA: CEA: 1.8 ng/mL (ref 0.0–5.0)

## 2011-01-06 ENCOUNTER — Encounter (HOSPITAL_BASED_OUTPATIENT_CLINIC_OR_DEPARTMENT_OTHER): Payer: Medicare Other | Admitting: Hematology and Oncology

## 2011-01-06 DIAGNOSIS — C20 Malignant neoplasm of rectum: Secondary | ICD-10-CM

## 2011-01-06 DIAGNOSIS — N289 Disorder of kidney and ureter, unspecified: Secondary | ICD-10-CM

## 2011-01-06 DIAGNOSIS — D638 Anemia in other chronic diseases classified elsewhere: Secondary | ICD-10-CM

## 2011-04-05 LAB — BASIC METABOLIC PANEL
CO2: 27
Chloride: 94 — ABNORMAL LOW
Creatinine, Ser: 1.57 — ABNORMAL HIGH
GFR calc Af Amer: 54 — ABNORMAL LOW
Sodium: 132 — ABNORMAL LOW

## 2011-04-05 LAB — URINALYSIS, ROUTINE W REFLEX MICROSCOPIC
Glucose, UA: NEGATIVE
Hgb urine dipstick: NEGATIVE
Protein, ur: NEGATIVE

## 2011-04-05 LAB — HEMOGLOBIN AND HEMATOCRIT, BLOOD: HCT: 35.3 — ABNORMAL LOW

## 2011-04-06 LAB — CBC
HCT: 37.6 — ABNORMAL LOW
Hemoglobin: 12.4 — ABNORMAL LOW
WBC: 8.4

## 2011-04-08 LAB — CROSSMATCH

## 2011-04-08 LAB — CBC
HCT: 22.8 — ABNORMAL LOW
HCT: 22.8 — ABNORMAL LOW
HCT: 23.5 — ABNORMAL LOW
HCT: 25.1 — ABNORMAL LOW
HCT: 25.9 — ABNORMAL LOW
HCT: 29.4 — ABNORMAL LOW
HCT: 32.9 — ABNORMAL LOW
Hemoglobin: 10.2 — ABNORMAL LOW
Hemoglobin: 11.1 — ABNORMAL LOW
Hemoglobin: 7.7 — CL
Hemoglobin: 8 — ABNORMAL LOW
Hemoglobin: 8 — ABNORMAL LOW
Hemoglobin: 8.6 — ABNORMAL LOW
MCHC: 34
MCHC: 34.8
MCHC: 34.9
MCHC: 35.2
MCV: 93.1
MCV: 93.9
MCV: 94.8
MCV: 96.7
Platelets: 136 — ABNORMAL LOW
Platelets: 94 — ABNORMAL LOW
RBC: 2.43 — ABNORMAL LOW
RBC: 2.45 — ABNORMAL LOW
RBC: 2.48 — ABNORMAL LOW
RBC: 3.4 — ABNORMAL LOW
RDW: 16.4 — ABNORMAL HIGH
RDW: 16.9 — ABNORMAL HIGH
WBC: 5.2
WBC: 5.4
WBC: 6.2
WBC: 8.9

## 2011-04-08 LAB — BASIC METABOLIC PANEL
BUN: 17
BUN: 22
CO2: 21
CO2: 22
Chloride: 101
Chloride: 102
Creatinine, Ser: 2.09 — ABNORMAL HIGH
GFR calc Af Amer: 39 — ABNORMAL LOW
GFR calc Af Amer: 54 — ABNORMAL LOW
GFR calc non Af Amer: 41 — ABNORMAL LOW
GFR calc non Af Amer: 45 — ABNORMAL LOW
Glucose, Bld: 283 — ABNORMAL HIGH
Potassium: 3.9
Potassium: 4.7
Potassium: 5.2 — ABNORMAL HIGH
Sodium: 128 — ABNORMAL LOW
Sodium: 139

## 2011-04-08 LAB — DIFFERENTIAL
Basophils Absolute: 0
Basophils Relative: 0
Eosinophils Relative: 4
Monocytes Absolute: 0.4
Monocytes Relative: 5
Neutro Abs: 7.8 — ABNORMAL HIGH

## 2011-04-08 LAB — CREATININE, SERUM
Creatinine, Ser: 2.12 — ABNORMAL HIGH
Creatinine, Ser: 2.27 — ABNORMAL HIGH
Creatinine, Ser: 2.3 — ABNORMAL HIGH
GFR calc Af Amer: 36 — ABNORMAL LOW
GFR calc non Af Amer: 29 — ABNORMAL LOW
GFR calc non Af Amer: 32 — ABNORMAL LOW
GFR calc non Af Amer: 34 — ABNORMAL LOW

## 2011-04-08 LAB — CULTURE, BLOOD (ROUTINE X 2)

## 2011-04-08 LAB — POCT I-STAT 4, (NA,K, GLUC, HGB,HCT)
HCT: 21 — ABNORMAL LOW
Hemoglobin: 7.1 — CL
Operator id: 103741
Potassium: 5.1
Sodium: 129 — ABNORMAL LOW

## 2011-04-08 LAB — URINE CULTURE
Colony Count: NO GROWTH
Culture: NO GROWTH

## 2011-04-08 LAB — POTASSIUM
Potassium: 3.9
Potassium: 4.1
Potassium: 4.7

## 2011-04-08 LAB — BODY FLUID CULTURE

## 2011-04-08 LAB — BUN: BUN: 28 — ABNORMAL HIGH

## 2011-04-08 LAB — PREALBUMIN: Prealbumin: 5 — ABNORMAL LOW

## 2011-04-09 LAB — URINALYSIS, ROUTINE W REFLEX MICROSCOPIC
Glucose, UA: 100 — AB
Ketones, ur: NEGATIVE
Nitrite: NEGATIVE
Protein, ur: 30 — AB
pH: 5

## 2011-04-09 LAB — URINE CULTURE

## 2011-04-09 LAB — URINE MICROSCOPIC-ADD ON

## 2011-08-07 ENCOUNTER — Telehealth: Payer: Self-pay | Admitting: Hematology and Oncology

## 2011-08-07 NOTE — Telephone Encounter (Signed)
pt aware of cxr and lab on 3/15 and dr lo on 3/19    aom per pof from 01/06/2011

## 2011-09-24 ENCOUNTER — Other Ambulatory Visit: Payer: Self-pay | Admitting: Hematology and Oncology

## 2011-09-24 ENCOUNTER — Other Ambulatory Visit (HOSPITAL_BASED_OUTPATIENT_CLINIC_OR_DEPARTMENT_OTHER): Payer: Medicare Other | Admitting: Lab

## 2011-09-24 ENCOUNTER — Ambulatory Visit (HOSPITAL_COMMUNITY)
Admission: RE | Admit: 2011-09-24 | Discharge: 2011-09-24 | Disposition: A | Discharge status: Home / Self Care | Payer: Medicare Other | Source: Ambulatory Visit | Attending: Hematology and Oncology | Admitting: Hematology and Oncology

## 2011-09-24 DIAGNOSIS — D638 Anemia in other chronic diseases classified elsewhere: Secondary | ICD-10-CM

## 2011-09-24 DIAGNOSIS — C20 Malignant neoplasm of rectum: Secondary | ICD-10-CM

## 2011-09-24 DIAGNOSIS — E119 Type 2 diabetes mellitus without complications: Secondary | ICD-10-CM | POA: Insufficient documentation

## 2011-09-24 DIAGNOSIS — N289 Disorder of kidney and ureter, unspecified: Secondary | ICD-10-CM

## 2011-09-24 DIAGNOSIS — I10 Essential (primary) hypertension: Secondary | ICD-10-CM | POA: Insufficient documentation

## 2011-09-24 DIAGNOSIS — I77810 Thoracic aortic ectasia: Secondary | ICD-10-CM | POA: Insufficient documentation

## 2011-09-24 LAB — CBC WITH DIFFERENTIAL/PLATELET
Basophils Absolute: 0 10*3/uL (ref 0.0–0.1)
EOS%: 4.3 % (ref 0.0–7.0)
Eosinophils Absolute: 0.3 10*3/uL (ref 0.0–0.5)
HGB: 10.8 g/dL — ABNORMAL LOW (ref 13.0–17.1)
LYMPH%: 6.6 % — ABNORMAL LOW (ref 14.0–49.0)
MCH: 31.2 pg (ref 27.2–33.4)
MCV: 92.9 fL (ref 79.3–98.0)
MONO%: 6.1 % (ref 0.0–14.0)
NEUT#: 6.2 10*3/uL (ref 1.5–6.5)
Platelets: 210 10*3/uL (ref 140–400)

## 2011-09-24 LAB — COMPREHENSIVE METABOLIC PANEL
AST: 20 U/L (ref 0–37)
Albumin: 3.6 g/dL (ref 3.5–5.2)
Alkaline Phosphatase: 87 U/L (ref 39–117)
BUN: 46 mg/dL — ABNORMAL HIGH (ref 6–23)
Creatinine, Ser: 2.79 mg/dL — ABNORMAL HIGH (ref 0.50–1.35)
Glucose, Bld: 193 mg/dL — ABNORMAL HIGH (ref 70–99)
Total Bilirubin: 0.4 mg/dL (ref 0.3–1.2)

## 2011-09-28 ENCOUNTER — Encounter: Payer: Self-pay | Admitting: Hematology and Oncology

## 2011-09-28 ENCOUNTER — Ambulatory Visit (HOSPITAL_BASED_OUTPATIENT_CLINIC_OR_DEPARTMENT_OTHER): Payer: Medicare Other | Admitting: Hematology and Oncology

## 2011-09-28 ENCOUNTER — Telehealth: Payer: Self-pay | Admitting: Hematology and Oncology

## 2011-09-28 VITALS — BP 147/72 | HR 83 | Temp 98.7°F | Ht 66.0 in | Wt 222.7 lb

## 2011-09-28 DIAGNOSIS — E7800 Pure hypercholesterolemia, unspecified: Secondary | ICD-10-CM

## 2011-09-28 DIAGNOSIS — E119 Type 2 diabetes mellitus without complications: Secondary | ICD-10-CM

## 2011-09-28 DIAGNOSIS — I10 Essential (primary) hypertension: Secondary | ICD-10-CM

## 2011-09-28 DIAGNOSIS — N289 Disorder of kidney and ureter, unspecified: Secondary | ICD-10-CM

## 2011-09-28 DIAGNOSIS — C20 Malignant neoplasm of rectum: Secondary | ICD-10-CM

## 2011-09-28 NOTE — Patient Instructions (Signed)
Patient to follow up as instructed.   Current Outpatient Prescriptions  Medication Sig Dispense Refill  . furosemide (LASIX) 40 MG tablet Take 40 mg by mouth daily.      Marland Kitchen glimepiride (AMARYL) 2 MG tablet Take 2 mg by mouth daily before breakfast.      . losartan (COZAAR) 50 MG tablet Take 50 mg by mouth daily.      . Omega-3 Fatty Acids (FISH OIL) 1200 MG CAPS Take 1 capsule by mouth daily.      . rosuvastatin (CRESTOR) 10 MG tablet Take 10 mg by mouth daily.      Marland Kitchen atorvastatin (LIPITOR) 20 MG tablet Take 20 mg by mouth at bedtime.            March 2013  Sunday Monday Tuesday Wednesday Thursday Friday Saturday                  1   2    3   4   5   6   7   8   9    10   11   12   13   14   15    DG X-RAY   3:55 PM  (15 min.)  Wl-Dg 4 (Chest)  [facility]-RADIOLOGY-[facility]AGNOSTIC   LAB MO     4:00 PM  (15 min.)  Sherrie Mustache  [facility] MEDICAL ONCOLOGY 16    17   18   19    EST PT 30   3:30 PM  (30 min.)  Laurice Record, MD  [facility] MEDICAL ONCOLOGY 20   21   22   23    24   25   26   27   28   29   30     31

## 2011-09-28 NOTE — Progress Notes (Signed)
CC:   Eugene [name], M.D. Rae Halsted, MD Graylin Shiver, M.D.  IDENTIFYING STATEMENT:  Patient is a 66 year old man with known rectal cancer who presents for followup.  INTERVAL HISTORY:  Ms. Folz was seen 9 months ago.  He has had no concerns since his last visit.  His weight is stable.  He has had no rectal bleeding.  He denies pain.  He received a chest x-ray on 09/24/2011 that showed no lung nodules or effusion.  MEDICATIONS:  Reviewed and updated.  ALLERGIES:  None.  PAST MEDICAL HISTORY:  Unchanged.  FAMILY HISTORY:  Unchanged.  SOCIAL HISTORY:  Unchanged.  REVIEW OF SYSTEMS:  10 point review of systems negative.  PHYSICAL EXAM:  General:  Patient is alert and oriented x3.  Vitals: Pulse 83, blood pressure 147/72, temperature 98.7, respirations 20, weight 222.7 pounds.  HEENT:  Head is atraumatic, normocephalic. Sclerae anicteric.  Mouth moist.  Neck:  Supple.  Chest:  Clear.  CVS: Unremarkable.  Abdomen:  Soft, nontender.  Bowel sounds present. Extremities:  No edema.  LAB DATA:  09/24/2011 white cell count 7.5, hemoglobin 10.8, hematocrit 32, platelets 210.  Sodium 139, potassium 4.4, chloride 101, CO2 30, BUN 46, creatinine 2.79, glucose 193.  T bili 0.4, alkaline phosphatase 87, AST 20, ALT 21, calcium 9.2.  CEA 1.8.  Chest x-ray as above.  IMPRESSION AND PLAN:  Mr. Clayson is a 66 year old man who is status post laparoscopic-assisted resection with diverting loop ileostomy for rectal cancer on 02/07/2008.  He received neoadjuvant chemotherapy with 5-FU concurrently with radiation.  He then received a takedown with reversal loop ileostomy which of course was complicated by surgical issues includes infection (MRSA).  He was last treated in July 2009.  His exam and blood work indicate no evidence of recurrence.  He will follow up in a year's time and at that time we will have him re-staged.  He will also be due for  colonoscopy.    ______________________________ Laurice Record, M.D. LIO/MEDQ  D:  09/28/2011  T:  09/28/2011  Job:  [id number]

## 2011-09-28 NOTE — Telephone Encounter (Signed)
gv pt appt for march 2014.  scheduled ct scan on 09/25/2012 @ [facility]

## 2011-09-28 NOTE — Progress Notes (Signed)
This office note has been dictated.

## 2012-09-25 ENCOUNTER — Other Ambulatory Visit: Payer: Medicare Other

## 2012-09-25 ENCOUNTER — Ambulatory Visit (HOSPITAL_COMMUNITY): Payer: Medicare Other

## 2012-09-27 ENCOUNTER — Ambulatory Visit: Payer: Medicare Other | Admitting: Hematology and Oncology

## 2012-09-28 ENCOUNTER — Other Ambulatory Visit: Payer: Self-pay | Admitting: Oncology

## 2012-09-28 ENCOUNTER — Ambulatory Visit: Payer: Medicare Other | Admitting: Oncology

## 2012-09-28 ENCOUNTER — Telehealth: Payer: Self-pay | Admitting: Oncology

## 2012-10-19 ENCOUNTER — Encounter: Payer: Self-pay | Admitting: Oncology

## 2012-11-06 ENCOUNTER — Other Ambulatory Visit: Payer: Medicare Other | Admitting: Lab

## 2012-11-06 ENCOUNTER — Ambulatory Visit (HOSPITAL_COMMUNITY)
Admission: RE | Admit: 2012-11-06 | Discharge: 2012-11-06 | Disposition: A | Discharge status: Home / Self Care | Payer: Medicare Other | Source: Ambulatory Visit | Attending: Oncology | Admitting: Oncology

## 2012-11-06 DIAGNOSIS — K8020 Calculus of gallbladder without cholecystitis without obstruction: Secondary | ICD-10-CM | POA: Insufficient documentation

## 2012-11-06 DIAGNOSIS — C20 Malignant neoplasm of rectum: Secondary | ICD-10-CM | POA: Insufficient documentation

## 2012-11-06 DIAGNOSIS — E041 Nontoxic single thyroid nodule: Secondary | ICD-10-CM | POA: Insufficient documentation

## 2012-11-07 ENCOUNTER — Ambulatory Visit (HOSPITAL_BASED_OUTPATIENT_CLINIC_OR_DEPARTMENT_OTHER): Payer: Medicare Other | Admitting: Oncology

## 2012-11-07 ENCOUNTER — Telehealth: Payer: Self-pay | Admitting: Oncology

## 2012-11-07 VITALS — BP 165/62 | HR 78 | Temp 98.3°F | Resp 20 | Ht 66.0 in | Wt 217.4 lb

## 2012-11-07 DIAGNOSIS — C20 Malignant neoplasm of rectum: Secondary | ICD-10-CM

## 2012-11-07 NOTE — Progress Notes (Signed)
Hematology and Oncology Follow Up Visit  Eugene [name] [id number] May 18, 1946 67 y.o. 11/07/2012 9:08 AM Sissy Hoff, MDSwayne, Eugene Climes, MD   Principle Diagnosis: 67 year old with T3N) rectal cancer diagnosed in in 2009.    Prior Therapy:  He is S/P neoadjuvant chemotherapy with 5-FU concurrently with radiation. This was completed in 01/2008. It was followed by laparoscopic-assisted resection with diverting loop ileostomy for rectal cancer on 02/07/2008. He then received a takedown with reversal  loop ileostomy which of course was complicated by surgical issues includes infection (MRSA).   Current therapy: Observation and follow up.   Interim History: Eugene [name] presents today for a follow up visit. He is a nice man with the above diagnosis. He has been doing well since his last visit. H reports no chest pain or GI symptoms. He reports no nausea or vomiting. No bleeding per rectum. He did have a colonoscopy in the last few weeks.  No new complaints since his last visit.   Medications: I have reviewed the patient's current medications. Current outpatient prescriptions:atorvastatin (LIPITOR) 20 MG tablet, Take 20 mg by mouth at bedtime., Disp: , Rfl: ;  furosemide (LASIX) 40 MG tablet, Take 40 mg by mouth daily., Disp: , Rfl: ;  glimepiride (AMARYL) 2 MG tablet, Take 2 mg by mouth daily before breakfast., Disp: , Rfl: ;  losartan (COZAAR) 50 MG tablet, Take 50 mg by mouth daily., Disp: , Rfl:  Omega-3 Fatty Acids (FISH OIL) 1200 MG CAPS, Take 1 capsule by mouth daily., Disp: , Rfl:   Allergies: No Known Allergies  Past Medical History, Surgical history, Social history, and Family History were reviewed and updated.  Review of Systems: Constitutional:  Negative for fever, chills, night sweats, anorexia, weight loss, pain. Cardiovascular: negative Respiratory: negative Neurological: negative Dermatological: negative ENT: negative Skin: Negative. Gastrointestinal:  negative Genito-Urinary: negative Hematological and Lymphatic: negative Breast: negative Musculoskeletal: negative Remaining ROS negative. Physical Exam: Blood pressure 165/62, pulse 78, temperature 98.3 F (36.8 C), temperature source Oral, resp. rate 20, height 5\' 6"  (1.676 m), weight 217 lb 6.4 oz (98.612 kg). ECOG: 1 General appearance: alert Head: Normocephalic, without obvious abnormality, atraumatic Neck: no adenopathy, no carotid bruit, no JVD, supple, symmetrical, trachea midline and thyroid not enlarged, symmetric, no tenderness/mass/nodules Lymph nodes: Cervical, supraclavicular, and axillary nodes normal. Heart:regular rate and rhythm, S1, S2 normal, no murmur, click, rub or gallop Lung:chest clear, no wheezing, rales, normal symmetric air entry Abdomin: soft, non-tender, without masses or organomegaly EXT:no erythema, induration, or nodules   Lab Results: Lab Results  Component Value Date   WBC 7.5 09/24/2011   HGB 10.8* 09/24/2011   HCT 32.0* 09/24/2011   MCV 92.9 09/24/2011   PLT 210 09/24/2011     Chemistry      Component Value Date/Time   NA 139 09/24/2011 1531   NA 136 01/01/2011 1516   K 4.4 09/24/2011 1531   K 4.5 01/01/2011 1516   CL 101 09/24/2011 1531   CL 96* 01/01/2011 1516   CO2 30 09/24/2011 1531   CO2 31 01/01/2011 1516   BUN 46* 09/24/2011 1531   BUN 37* 01/01/2011 1516   CREATININE 2.79* 09/24/2011 1531   CREATININE 2.7* 01/01/2011 1516      Component Value Date/Time   CALCIUM 9.2 09/24/2011 1531   CALCIUM 9.2 01/01/2011 1516   ALKPHOS 87 09/24/2011 1531   ALKPHOS 80 01/01/2011 1516   AST 20 09/24/2011 1531   AST 25 01/01/2011 1516   ALT 21  09/24/2011 1531   BILITOT 0.4 09/24/2011 1531   BILITOT 0.50 01/01/2011 1516       Radiological Studies: Ct Abdomen Pelvis Wo Contrast  11/06/2012  *RADIOLOGY REPORT*  Clinical Data:  Rectal cancer  CT CHEST, ABDOMEN AND PELVIS WITHOUT CONTRAST  Technique:  Multidetector CT imaging of the chest, abdomen and pelvis  was performed following the standard protocol without IV contrast.  Comparison:  01/01/2011  CT CHEST  Findings:  There is no axillary, mediastinal, or hilar lymphadenopathy. Exophytic right thyroid nodule is stable.  Heart size is normal. Coronary artery calcification is noted.  No pericardial or pleural effusion.  Lung windows show no parenchymal nodule or mass.  Bone windows reveal no worrisome lytic or sclerotic osseous lesions.  IMPRESSION: No evidence for metastatic disease in the chest.  CT ABDOMEN AND PELVIS  Findings:  No focal abnormalities seen in the liver or spleen on this study performed without intravenous contrast material.  The stomach, duodenum, pancreas, adrenal glands are normal.  17 mm mineralized stone is identified in the lumen of the gallbladder. No intra or extrahepatic biliary duct dilatation.  The pancreas is diffusely fatty replaced.  No abdominal aortic aneurysm.  No evidence for focal mass lesion or hydronephrosis in either kidney.  No lymphadenopathy or free fluid in the abdomen.  Abdominal bowel loops are unremarkable.  Imaging through the pelvis shows no free intraperitoneal fluid. There is no pelvic sidewall lymphadenopathy.  Bladder is unremarkable.  Postsurgical changes are seen in the central lower pelvis with clips and a band of abnormal soft tissue attenuation seen in the presacral space.  Overall imaging features are stable in the interval.  Diverticular changes are noted in the transverse colon without diverticulitis.  Terminal ileum is normal.  The appendix is normal.  Bone windows reveal no worrisome lytic or sclerotic osseous lesions.  IMPRESSION: Stable exam.  Post-therapy changes in the central pelvis and presacral region.  No new or progressive findings to suggest metastatic disease.  Cholelithiasis.   Original Report Authenticated By: Kennith Center, M.D.    Ct Chest Wo Contrast  11/06/2012  *RADIOLOGY REPORT*  Clinical Data:  Rectal cancer  CT CHEST, ABDOMEN AND  PELVIS WITHOUT CONTRAST  Technique:  Multidetector CT imaging of the chest, abdomen and pelvis was performed following the standard protocol without IV contrast.  Comparison:  01/01/2011  CT CHEST  Findings:  There is no axillary, mediastinal, or hilar lymphadenopathy. Exophytic right thyroid nodule is stable.  Heart size is normal. Coronary artery calcification is noted.  No pericardial or pleural effusion.  Lung windows show no parenchymal nodule or mass.  Bone windows reveal no worrisome lytic or sclerotic osseous lesions.  IMPRESSION: No evidence for metastatic disease in the chest.  CT ABDOMEN AND PELVIS  Findings:  No focal abnormalities seen in the liver or spleen on this study performed without intravenous contrast material.  The stomach, duodenum, pancreas, adrenal glands are normal.  17 mm mineralized stone is identified in the lumen of the gallbladder. No intra or extrahepatic biliary duct dilatation.  The pancreas is diffusely fatty replaced.  No abdominal aortic aneurysm.  No evidence for focal mass lesion or hydronephrosis in either kidney.  No lymphadenopathy or free fluid in the abdomen.  Abdominal bowel loops are unremarkable.  Imaging through the pelvis shows no free intraperitoneal fluid. There is no pelvic sidewall lymphadenopathy.  Bladder is unremarkable.  Postsurgical changes are seen in the central lower pelvis with clips and a band of abnormal  soft tissue attenuation seen in the presacral space.  Overall imaging features are stable in the interval.  Diverticular changes are noted in the transverse colon without diverticulitis.  Terminal ileum is normal.  The appendix is normal.  Bone windows reveal no worrisome lytic or sclerotic osseous lesions.  IMPRESSION: Stable exam.  Post-therapy changes in the central pelvis and presacral region.  No new or progressive findings to suggest metastatic disease.  Cholelithiasis.   Original Report Authenticated By: Kennith Center, M.D.      Impression  and Plan:  Eugene [name] is a 67 year old man who is status post laparoscopic-assisted resection with diverting loop ileostomy for rectal cancer on 02/07/2008.   His CT scan discussed today and did not show any evidence of cancer relapse.   He is up to date on colonoscopies. He will be due in 2017.   His follow up will be in 12 months with labs and exam. CT scan will  Be done as needed.   [facility], MD 4/29/20149:08 AM

## 2013-06-22 ENCOUNTER — Other Ambulatory Visit: Payer: Self-pay | Admitting: Dermatology

## 2013-11-07 ENCOUNTER — Other Ambulatory Visit (HOSPITAL_BASED_OUTPATIENT_CLINIC_OR_DEPARTMENT_OTHER): Payer: Medicare Other

## 2013-11-07 ENCOUNTER — Ambulatory Visit (HOSPITAL_BASED_OUTPATIENT_CLINIC_OR_DEPARTMENT_OTHER): Payer: Medicare Other | Admitting: Oncology

## 2013-11-07 VITALS — BP 174/74 | HR 85 | Temp 98.1°F | Resp 18 | Ht 66.0 in | Wt 221.9 lb

## 2013-11-07 DIAGNOSIS — Z85048 Personal history of other malignant neoplasm of rectum, rectosigmoid junction, and anus: Secondary | ICD-10-CM

## 2013-11-07 DIAGNOSIS — C20 Malignant neoplasm of rectum: Secondary | ICD-10-CM

## 2013-11-07 LAB — CBC WITH DIFFERENTIAL/PLATELET
BASO%: 0.5 % (ref 0.0–2.0)
Basophils Absolute: 0 10*3/uL (ref 0.0–0.1)
EOS%: 5.1 % (ref 0.0–7.0)
Eosinophils Absolute: 0.5 10*3/uL (ref 0.0–0.5)
HCT: 36.7 % — ABNORMAL LOW (ref 38.4–49.9)
HGB: 12 g/dL — ABNORMAL LOW (ref 13.0–17.1)
LYMPH#: 0.7 10*3/uL — AB (ref 0.9–3.3)
LYMPH%: 8.2 % — ABNORMAL LOW (ref 14.0–49.0)
MCH: 32.5 pg (ref 27.2–33.4)
MCHC: 32.8 g/dL (ref 32.0–36.0)
MCV: 99 fL — AB (ref 79.3–98.0)
MONO#: 0.5 10*3/uL (ref 0.1–0.9)
MONO%: 5.9 % (ref 0.0–14.0)
NEUT#: 7.2 10*3/uL — ABNORMAL HIGH (ref 1.5–6.5)
NEUT%: 80.3 % — AB (ref 39.0–75.0)
Platelets: 200 10*3/uL (ref 140–400)
RBC: 3.7 10*6/uL — ABNORMAL LOW (ref 4.20–5.82)
RDW: 14.7 % — AB (ref 11.0–14.6)
WBC: 9 10*3/uL (ref 4.0–10.3)

## 2013-11-07 LAB — COMPREHENSIVE METABOLIC PANEL (CC13)
ALK PHOS: 114 U/L (ref 40–150)
ALT: 26 U/L (ref 0–55)
AST: 24 U/L (ref 5–34)
Albumin: 3.4 g/dL — ABNORMAL LOW (ref 3.5–5.0)
Anion Gap: 10 mEq/L (ref 3–11)
BUN: 38.8 mg/dL — ABNORMAL HIGH (ref 7.0–26.0)
CALCIUM: 9.6 mg/dL (ref 8.4–10.4)
CHLORIDE: 102 meq/L (ref 98–109)
CO2: 27 mEq/L (ref 22–29)
CREATININE: 2.7 mg/dL — AB (ref 0.7–1.3)
Glucose: 176 mg/dl — ABNORMAL HIGH (ref 70–140)
POTASSIUM: 4.2 meq/L (ref 3.5–5.1)
Sodium: 140 mEq/L (ref 136–145)
Total Bilirubin: 0.69 mg/dL (ref 0.20–1.20)
Total Protein: 7 g/dL (ref 6.4–8.3)

## 2013-11-07 NOTE — Progress Notes (Signed)
Hematology and Oncology Follow Up Visit  Eugene [name] [id number] [birth year] 68 y.o. 11/07/2013 3:28 PM Eugene [name], MDSwayne, Eugene Green, MD   Principle Diagnosis: 68 year old with T3N0 rectal cancer diagnosed in in 2009.    Prior Therapy:  He is S/P neoadjuvant chemotherapy with 5-FU concurrently with radiation. This was completed in 01/2008. It was followed by laparoscopic-assisted resection with diverting loop ileostomy for rectal cancer on 02/07/2008. He then received a takedown with reversal  loop ileostomy which of course was complicated by surgical issues includes infection (MRSA).   Current therapy: Observation and follow up.   Interim History: Eugene [name] presents today for a follow up visit. He is a nice man with the above diagnosis. He has been doing well since his last visit. He reports no chest pain or GI symptoms. He reports no nausea or vomiting. No bleeding per rectum. He has not reported any headaches or blurry vision or double vision. Has not reported any chest pain or difficulty breathing. Has not reported any nausea or vomiting or genitourinary complaints. Does not report any lymphadenopathy or skin rashes. He has very limited vision and does not do a lot of work outside his house but is able to maneuver inside his house properly. Has not had any falls or illnesses.  Medications: I have reviewed the patient's current medications.   Current Outpatient Prescriptions  Medication Sig Dispense Refill  . allopurinol (ZYLOPRIM) 100 MG tablet Take 500 mg by mouth 2 (two) times daily. Pt takes one tablet in AM and one tablet in PM.      . furosemide (LASIX) 40 MG tablet Take 40 mg by mouth daily.      Marland Kitchen glimepiride (AMARYL) 2 MG tablet Take 2 mg by mouth daily before breakfast.      . losartan (COZAAR) 50 MG tablet Take 50 mg by mouth daily.      Marland Kitchen atorvastatin (LIPITOR) 20 MG tablet Take 20 mg by mouth at bedtime.      . Omega-3 Fatty Acids (FISH OIL) 1200 MG CAPS Take 1 capsule  by mouth daily.       No current facility-administered medications for this visit.    Allergies: No Known Allergies  Past Medical History, Surgical history, Social history, and Family History were reviewed and updated.  Review of Systems: Constitutional:  Negative for fever, chills, night sweats, anorexia, weight loss, pain.  Remaining ROS negative. Physical Exam: Blood pressure 174/74, pulse 85, temperature 98.1 F (36.7 C), temperature source Oral, resp. rate 18, height 5\' 6"  (1.676 m), weight 221 lb 14.4 oz (100.653 kg), SpO2 100.00%. ECOG: 1 General appearance: alert awake not in any distress. Head: Normocephalic, without obvious abnormality, atraumatic Neck: no adenopathy, no carotid bruit, no JVD, supple, symmetrical, trachea midline and thyroid not enlarged, symmetric, no tenderness/mass/nodules Lymph nodes: Cervical, supraclavicular, and axillary nodes normal. Heart:regular rate and rhythm, S1, S2 normal, no murmur, click, rub or gallop Lung:chest clear, no wheezing, rales, normal symmetric air entry Abdomin: soft, non-tender, without masses or organomegaly EXT:no erythema, induration, or nodules   Lab Results: Lab Results  Component Value Date   WBC 9.0 11/07/2013   HGB 12.0* 11/07/2013   HCT 36.7* 11/07/2013   MCV 99.0* 11/07/2013   PLT 200 11/07/2013     Chemistry      Component Value Date/Time   NA 139 09/24/2011 1531   NA 136 01/01/2011 1516   K 4.4 09/24/2011 1531   K 4.5 01/01/2011 1516   CL  101 09/24/2011 1531   CL 96* 01/01/2011 1516   CO2 30 09/24/2011 1531   CO2 31 01/01/2011 1516   BUN 46* 09/24/2011 1531   BUN 37* 01/01/2011 1516   CREATININE 2.79* 09/24/2011 1531   CREATININE 2.7* 01/01/2011 1516      Component Value Date/Time   CALCIUM 9.2 09/24/2011 1531   CALCIUM 9.2 01/01/2011 1516   ALKPHOS 87 09/24/2011 1531   ALKPHOS 80 01/01/2011 1516   AST 20 09/24/2011 1531   AST 25 01/01/2011 1516   ALT 21 09/24/2011 1531   ALT 28 01/01/2011 1516   BILITOT 0.4  09/24/2011 1531   BILITOT 0.50 01/01/2011 1516       Impression and Plan:  Eugene [name] is a 68 year old man who is status post laparoscopic-assisted resection with diverting loop ileostomy for rectal cancer on 02/07/2008.   His CT scan from 2014 did not show any evidence of cancer relapse.   He is up to date on colonoscopies. He will be due in 2017.   At this point is really no need for any scheduled oncology followup by we will be happy to see him as needed.  Eugene Portela, MD 4/29/20153:28 PM

## 2013-11-08 LAB — CEA: CEA: 2.9 ng/mL (ref 0.0–5.0)

## 2013-12-05 ENCOUNTER — Other Ambulatory Visit: Payer: Self-pay | Admitting: Family Medicine

## 2013-12-05 DIAGNOSIS — Z139 Encounter for screening, unspecified: Secondary | ICD-10-CM

## 2013-12-26 ENCOUNTER — Ambulatory Visit
Admission: RE | Admit: 2013-12-26 | Discharge: 2013-12-26 | Disposition: A | Discharge status: Home / Self Care | Payer: Medicare Other | Source: Ambulatory Visit | Attending: Family Medicine | Admitting: Family Medicine

## 2013-12-26 DIAGNOSIS — Z139 Encounter for screening, unspecified: Secondary | ICD-10-CM

## 2015-02-13 ENCOUNTER — Other Ambulatory Visit: Payer: Self-pay | Admitting: Nephrology

## 2015-02-13 DIAGNOSIS — N184 Chronic kidney disease, stage 4 (severe): Secondary | ICD-10-CM

## 2015-02-14 ENCOUNTER — Ambulatory Visit
Admission: RE | Admit: 2015-02-14 | Discharge: 2015-02-14 | Disposition: A | Discharge status: Home / Self Care | Payer: Medicare Other | Source: Ambulatory Visit | Attending: Nephrology | Admitting: Nephrology

## 2015-02-14 DIAGNOSIS — N184 Chronic kidney disease, stage 4 (severe): Secondary | ICD-10-CM

## 2015-02-22 ENCOUNTER — Emergency Department (HOSPITAL_COMMUNITY): Payer: Medicare Other

## 2015-02-22 ENCOUNTER — Inpatient Hospital Stay (HOSPITAL_COMMUNITY)
Admission: EM | Admit: 2015-02-22 | Discharge: 2015-02-25 | DRG: 682 | Disposition: A | Discharge status: Home / Self Care | Payer: Medicare Other | Attending: Family Medicine | Admitting: Family Medicine

## 2015-02-22 ENCOUNTER — Encounter (HOSPITAL_COMMUNITY): Payer: Self-pay

## 2015-02-22 ENCOUNTER — Inpatient Hospital Stay (HOSPITAL_COMMUNITY): Payer: Medicare Other

## 2015-02-22 DIAGNOSIS — I10 Essential (primary) hypertension: Secondary | ICD-10-CM | POA: Diagnosis not present

## 2015-02-22 DIAGNOSIS — Z923 Personal history of irradiation: Secondary | ICD-10-CM

## 2015-02-22 DIAGNOSIS — E1122 Type 2 diabetes mellitus with diabetic chronic kidney disease: Secondary | ICD-10-CM | POA: Diagnosis present

## 2015-02-22 DIAGNOSIS — Z87891 Personal history of nicotine dependence: Secondary | ICD-10-CM

## 2015-02-22 DIAGNOSIS — R778 Other specified abnormalities of plasma proteins: Secondary | ICD-10-CM

## 2015-02-22 DIAGNOSIS — N184 Chronic kidney disease, stage 4 (severe): Secondary | ICD-10-CM | POA: Diagnosis present

## 2015-02-22 DIAGNOSIS — E669 Obesity, unspecified: Secondary | ICD-10-CM | POA: Diagnosis present

## 2015-02-22 DIAGNOSIS — R0600 Dyspnea, unspecified: Secondary | ICD-10-CM

## 2015-02-22 DIAGNOSIS — E7800 Pure hypercholesterolemia, unspecified: Secondary | ICD-10-CM | POA: Diagnosis present

## 2015-02-22 DIAGNOSIS — A281 Cat-scratch disease: Secondary | ICD-10-CM | POA: Diagnosis present

## 2015-02-22 DIAGNOSIS — E785 Hyperlipidemia, unspecified: Secondary | ICD-10-CM | POA: Diagnosis present

## 2015-02-22 DIAGNOSIS — R600 Localized edema: Secondary | ICD-10-CM | POA: Diagnosis present

## 2015-02-22 DIAGNOSIS — R7989 Other specified abnormal findings of blood chemistry: Secondary | ICD-10-CM | POA: Diagnosis not present

## 2015-02-22 DIAGNOSIS — R140 Abdominal distension (gaseous): Secondary | ICD-10-CM

## 2015-02-22 DIAGNOSIS — R109 Unspecified abdominal pain: Secondary | ICD-10-CM | POA: Diagnosis present

## 2015-02-22 DIAGNOSIS — Z6834 Body mass index (BMI) 34.0-34.9, adult: Secondary | ICD-10-CM

## 2015-02-22 DIAGNOSIS — E119 Type 2 diabetes mellitus without complications: Secondary | ICD-10-CM | POA: Diagnosis not present

## 2015-02-22 DIAGNOSIS — Z79899 Other long term (current) drug therapy: Secondary | ICD-10-CM | POA: Diagnosis not present

## 2015-02-22 DIAGNOSIS — D72829 Elevated white blood cell count, unspecified: Secondary | ICD-10-CM | POA: Diagnosis present

## 2015-02-22 DIAGNOSIS — N289 Disorder of kidney and ureter, unspecified: Secondary | ICD-10-CM

## 2015-02-22 DIAGNOSIS — D631 Anemia in chronic kidney disease: Secondary | ICD-10-CM | POA: Diagnosis present

## 2015-02-22 DIAGNOSIS — M545 Low back pain: Secondary | ICD-10-CM | POA: Diagnosis present

## 2015-02-22 DIAGNOSIS — N189 Chronic kidney disease, unspecified: Secondary | ICD-10-CM

## 2015-02-22 DIAGNOSIS — C20 Malignant neoplasm of rectum: Secondary | ICD-10-CM | POA: Diagnosis present

## 2015-02-22 DIAGNOSIS — I5031 Acute diastolic (congestive) heart failure: Secondary | ICD-10-CM | POA: Diagnosis present

## 2015-02-22 DIAGNOSIS — G4733 Obstructive sleep apnea (adult) (pediatric): Secondary | ICD-10-CM | POA: Diagnosis present

## 2015-02-22 DIAGNOSIS — I129 Hypertensive chronic kidney disease with stage 1 through stage 4 chronic kidney disease, or unspecified chronic kidney disease: Secondary | ICD-10-CM | POA: Diagnosis present

## 2015-02-22 DIAGNOSIS — Z85048 Personal history of other malignant neoplasm of rectum, rectosigmoid junction, and anus: Secondary | ICD-10-CM

## 2015-02-22 DIAGNOSIS — H548 Legal blindness, as defined in USA: Secondary | ICD-10-CM | POA: Diagnosis present

## 2015-02-22 DIAGNOSIS — N179 Acute kidney failure, unspecified: Secondary | ICD-10-CM | POA: Diagnosis present

## 2015-02-22 DIAGNOSIS — Z9221 Personal history of antineoplastic chemotherapy: Secondary | ICD-10-CM | POA: Diagnosis not present

## 2015-02-22 DIAGNOSIS — Z66 Do not resuscitate: Secondary | ICD-10-CM | POA: Diagnosis present

## 2015-02-22 DIAGNOSIS — R079 Chest pain, unspecified: Secondary | ICD-10-CM | POA: Insufficient documentation

## 2015-02-22 DIAGNOSIS — D649 Anemia, unspecified: Secondary | ICD-10-CM

## 2015-02-22 DIAGNOSIS — R609 Edema, unspecified: Secondary | ICD-10-CM

## 2015-02-22 DIAGNOSIS — I5033 Acute on chronic diastolic (congestive) heart failure: Secondary | ICD-10-CM | POA: Diagnosis not present

## 2015-02-22 HISTORY — DX: Disorder of kidney and ureter, unspecified: N28.9

## 2015-02-22 LAB — URINALYSIS, ROUTINE W REFLEX MICROSCOPIC
BILIRUBIN URINE: NEGATIVE
Glucose, UA: NEGATIVE mg/dL
KETONES UR: NEGATIVE mg/dL
LEUKOCYTES UA: NEGATIVE
NITRITE: NEGATIVE
PH: 6.5 (ref 5.0–8.0)
PROTEIN: 100 mg/dL — AB
Specific Gravity, Urine: 1.016 (ref 1.005–1.030)
Urobilinogen, UA: 0.2 mg/dL (ref 0.0–1.0)

## 2015-02-22 LAB — BASIC METABOLIC PANEL
ANION GAP: 10 (ref 5–15)
BUN: 47 mg/dL — ABNORMAL HIGH (ref 6–20)
CALCIUM: 8.9 mg/dL (ref 8.9–10.3)
CHLORIDE: 97 mmol/L — AB (ref 101–111)
CO2: 26 mmol/L (ref 22–32)
Creatinine, Ser: 3.18 mg/dL — ABNORMAL HIGH (ref 0.61–1.24)
GFR calc Af Amer: 21 mL/min — ABNORMAL LOW (ref >60)
GFR calc non Af Amer: 18 mL/min — ABNORMAL LOW (ref >60)
Glucose, Bld: 167 mg/dL — ABNORMAL HIGH (ref 65–99)
Potassium: 4.4 mmol/L (ref 3.5–5.1)
Sodium: 133 mmol/L — ABNORMAL LOW (ref 135–145)

## 2015-02-22 LAB — HEPATIC FUNCTION PANEL
ALBUMIN: 2.6 g/dL — AB (ref 3.5–5.0)
ALT: 26 U/L (ref 17–63)
AST: 29 U/L (ref 15–41)
Alkaline Phosphatase: 110 U/L (ref 38–126)
BILIRUBIN INDIRECT: 0.3 mg/dL (ref 0.3–0.9)
BILIRUBIN TOTAL: 0.4 mg/dL (ref 0.3–1.2)
Bilirubin, Direct: 0.1 mg/dL (ref 0.1–0.5)
Total Protein: 6.5 g/dL (ref 6.5–8.1)

## 2015-02-22 LAB — CBC
HCT: 29 % — ABNORMAL LOW (ref 39.0–52.0)
HEMATOCRIT: 27.9 % — AB (ref 39.0–52.0)
Hemoglobin: 9.6 g/dL — ABNORMAL LOW (ref 13.0–17.0)
Hemoglobin: 9.4 g/dL — ABNORMAL LOW (ref 13.0–17.0)
MCH: 32.9 pg (ref 26.0–34.0)
MCH: 33.2 pg (ref 26.0–34.0)
MCHC: 33.1 g/dL (ref 30.0–36.0)
MCHC: 33.7 g/dL (ref 30.0–36.0)
MCV: 99.3 fL (ref 78.0–100.0)
MCV: 98.6 fL (ref 78.0–100.0)
PLATELETS: 245 10*3/uL (ref 150–400)
Platelets: 235 10*3/uL (ref 150–400)
RBC: 2.92 MIL/uL — AB (ref 4.22–5.81)
RBC: 2.83 MIL/uL — ABNORMAL LOW (ref 4.22–5.81)
RDW: 14.2 % (ref 11.5–15.5)
RDW: 14.1 % (ref 11.5–15.5)
WBC: 19 10*3/uL — ABNORMAL HIGH (ref 4.0–10.5)
WBC: 17.4 10*3/uL — ABNORMAL HIGH (ref 4.0–10.5)

## 2015-02-22 LAB — DIFFERENTIAL
Basophils Absolute: 0 10*3/uL (ref 0.0–0.1)
Basophils Relative: 0 % (ref 0–1)
EOS ABS: 0.5 10*3/uL (ref 0.0–0.7)
Eosinophils Relative: 3 % (ref 0–5)
LYMPHS PCT: 3 % — AB (ref 12–46)
Lymphs Abs: 0.6 10*3/uL — ABNORMAL LOW (ref 0.7–4.0)
MONO ABS: 0.7 10*3/uL (ref 0.1–1.0)
MONOS PCT: 4 % (ref 3–12)
NEUTROS ABS: 15.9 10*3/uL — AB (ref 1.7–7.7)
NEUTROS PCT: 90 % — AB (ref 43–77)

## 2015-02-22 LAB — TROPONIN I: Troponin I: 0.08 ng/mL — ABNORMAL HIGH (ref <0.031)

## 2015-02-22 LAB — GLUCOSE, CAPILLARY
GLUCOSE-CAPILLARY: 159 mg/dL — AB (ref 65–99)
GLUCOSE-CAPILLARY: 164 mg/dL — AB (ref 65–99)
GLUCOSE-CAPILLARY: 206 mg/dL — AB (ref 65–99)

## 2015-02-22 LAB — CBG MONITORING, ED: GLUCOSE-CAPILLARY: 171 mg/dL — AB (ref 65–99)

## 2015-02-22 LAB — CREATININE, SERUM
CREATININE: 3.08 mg/dL — AB (ref 0.61–1.24)
GFR calc Af Amer: 22 mL/min — ABNORMAL LOW (ref >60)
GFR, EST NON AFRICAN AMERICAN: 19 mL/min — AB (ref >60)

## 2015-02-22 LAB — BRAIN NATRIURETIC PEPTIDE: B NATRIURETIC PEPTIDE 5: 228.8 pg/mL — AB (ref 0.0–100.0)

## 2015-02-22 LAB — URINE MICROSCOPIC-ADD ON

## 2015-02-22 LAB — I-STAT TROPONIN, ED: TROPONIN I, POC: 0 ng/mL (ref 0.00–0.08)

## 2015-02-22 MED ORDER — ASPIRIN EC 81 MG PO TBEC
81.0000 mg | DELAYED_RELEASE_TABLET | Freq: Every day | ORAL | Status: DC
  Administered 2015-02-22 – 2015-02-25 (×4): 81 mg via ORAL
  Filled 2015-02-22 (×6): qty 1

## 2015-02-22 MED ORDER — INSULIN ASPART 100 UNIT/ML ~~LOC~~ SOLN
0.0000 [IU] | Freq: Three times a day (TID) | SUBCUTANEOUS | Status: DC
  Administered 2015-02-22 (×2): 2 [IU] via SUBCUTANEOUS
  Administered 2015-02-23: 3 [IU] via SUBCUTANEOUS
  Administered 2015-02-23: 2 [IU] via SUBCUTANEOUS
  Administered 2015-02-24: 1 [IU] via SUBCUTANEOUS
  Administered 2015-02-24: 3 [IU] via SUBCUTANEOUS
  Administered 2015-02-25: 5 [IU] via SUBCUTANEOUS
  Administered 2015-02-25: 2 [IU] via SUBCUTANEOUS

## 2015-02-22 MED ORDER — HEPARIN SODIUM (PORCINE) 5000 UNIT/ML IJ SOLN
5000.0000 [IU] | Freq: Three times a day (TID) | INTRAMUSCULAR | Status: DC
  Administered 2015-02-22 – 2015-02-25 (×10): 5000 [IU] via SUBCUTANEOUS
  Filled 2015-02-22 (×7): qty 1

## 2015-02-22 MED ORDER — ONDANSETRON HCL 4 MG PO TABS: 4.0000 mg | ORAL_TABLET | Freq: Four times a day (QID) | ORAL | Status: DC | PRN

## 2015-02-22 MED ORDER — LABETALOL HCL 200 MG PO TABS
200.0000 mg | ORAL_TABLET | Freq: Two times a day (BID) | ORAL | Status: DC
  Administered 2015-02-22 – 2015-02-25 (×7): 200 mg via ORAL
  Filled 2015-02-22 (×8): qty 1

## 2015-02-22 MED ORDER — OMEGA-3-ACID ETHYL ESTERS 1 G PO CAPS
1.0000 g | ORAL_CAPSULE | Freq: Every day | ORAL | Status: DC
  Administered 2015-02-22 – 2015-02-25 (×4): 1 g via ORAL
  Filled 2015-02-22 (×5): qty 1

## 2015-02-22 MED ORDER — FUROSEMIDE 10 MG/ML IJ SOLN
40.0000 mg | Freq: Once | INTRAMUSCULAR | Status: AC
  Administered 2015-02-22: 40 mg via INTRAVENOUS
  Filled 2015-02-22: qty 4

## 2015-02-22 MED ORDER — SODIUM CHLORIDE 0.9 % IJ SOLN
3.0000 mL | Freq: Two times a day (BID) | INTRAMUSCULAR | Status: DC
  Administered 2015-02-22 – 2015-02-25 (×6): 3 mL via INTRAVENOUS

## 2015-02-22 MED ORDER — ALLOPURINOL 300 MG PO TABS
500.0000 mg | ORAL_TABLET | Freq: Two times a day (BID) | ORAL | Status: DC
  Administered 2015-02-22 – 2015-02-25 (×7): 500 mg via ORAL
  Filled 2015-02-22 (×5): qty 2
  Filled 2015-02-22 (×2): qty 1
  Filled 2015-02-22: qty 2
  Filled 2015-02-22 (×4): qty 1
  Filled 2015-02-22 (×2): qty 2

## 2015-02-22 MED ORDER — POLYETHYLENE GLYCOL 3350 17 G PO PACK
17.0000 g | PACK | Freq: Every day | ORAL | Status: DC | PRN
Start: 2015-02-22 — End: 2015-02-25

## 2015-02-22 MED ORDER — ADULT MULTIVITAMIN W/MINERALS CH
1.0000 | ORAL_TABLET | Freq: Every day | ORAL | Status: DC
  Administered 2015-02-22 – 2015-02-25 (×4): 1 via ORAL
  Filled 2015-02-22 (×4): qty 1

## 2015-02-22 MED ORDER — ONDANSETRON HCL 4 MG/2ML IJ SOLN: 4.0000 mg | Freq: Four times a day (QID) | INTRAMUSCULAR | Status: DC | PRN

## 2015-02-22 MED ORDER — BISACODYL 10 MG RE SUPP: 10.0000 mg | Freq: Every day | RECTAL | Status: DC | PRN

## 2015-02-22 MED ORDER — POLYETHYLENE GLYCOL 3350 17 G PO PACK
17.0000 g | PACK | Freq: Every day | ORAL | Status: DC
  Administered 2015-02-22: 17 g via ORAL
  Filled 2015-02-22 (×3): qty 1

## 2015-02-22 MED ORDER — AMOXICILLIN-POT CLAVULANATE 500-125 MG PO TABS
1.0000 | ORAL_TABLET | Freq: Two times a day (BID) | ORAL | Status: DC
  Administered 2015-02-22 – 2015-02-24 (×6): 500 mg via ORAL
  Filled 2015-02-22 (×6): qty 1

## 2015-02-22 MED ORDER — FUROSEMIDE 10 MG/ML IJ SOLN
40.0000 mg | Freq: Two times a day (BID) | INTRAMUSCULAR | Status: DC
  Administered 2015-02-22 – 2015-02-23 (×2): 40 mg via INTRAVENOUS
  Filled 2015-02-22 (×2): qty 4

## 2015-02-22 MED ORDER — ATORVASTATIN CALCIUM 20 MG PO TABS
20.0000 mg | ORAL_TABLET | Freq: Every day | ORAL | Status: DC
  Administered 2015-02-22 – 2015-02-24 (×3): 20 mg via ORAL
  Filled 2015-02-22 (×3): qty 1

## 2015-02-22 MED ORDER — ACETAMINOPHEN 325 MG PO TABS
650.0000 mg | ORAL_TABLET | Freq: Four times a day (QID) | ORAL | Status: DC | PRN
Start: 2015-02-22 — End: 2015-02-25
  Administered 2015-02-23: 650 mg via ORAL
  Filled 2015-02-22: qty 2

## 2015-02-22 NOTE — ED Notes (Addendum)
Pt states that a week ago he went to see his nephrologist and was placed on a different BP medication Labetalol(2-500mg  per day) and he states he is having side effects from it. Pt states that he can't sleep, he's wheezing, his chest is tight and he has a headache. Pt states he also got scratched by a cat on Saturday and he went to the dr for that the day after and he was given antibiotics. Pt states that he has had chest tightness since Sunday and cold sx's. Pt is also completely blind.

## 2015-02-22 NOTE — ED Notes (Signed)
Dr Glick in room. 

## 2015-02-22 NOTE — ED Notes (Signed)
Patient transported to X-ray 

## 2015-02-22 NOTE — ED Provider Notes (Signed)
CSN: [id number]     Arrival date & time 02/22/15  0537 History   First MD Initiated Contact with Patient 02/22/15 [phone]     Chief Complaint  Patient presents with  . Chest Pain     (Consider location/radiation/quality/duration/timing/severity/associated sxs/prior Treatment) Patient is a 69 y.o. male presenting with chest pain. The history is provided by the patient.  Chest Pain He states that he saw his nephrologist one week ago and had his blood pressure medication changed to labetalol. He states that he is taking 500 mg twice a day. Since then, he has noted some increase in his leg swelling, difficulty breathing with a tight feeling in his chest and he states that he has not been able to sleep. There is no nausea or vomiting and he denies any actual chest pain. He has had headache with this. He states he has not had any appetite. Also, he was also treated at about the same time for his cat scratching them on his left foot and it sounds like he was put on amoxicillin-clavulanic acid. He denies any diarrhea and she states that he has had fewer bowel movements than normal. He states that his creatinine had gone up to over 3, but was down to 2.8 when he saw his nephrologist last week.  Past Medical History  Diagnosis Date  . Cancer     rectal ca  . Hypertension   . Type 2 diabetes mellitus   . Anemia   . Renal disorder    Past Surgical History  Procedure Laterality Date  . Elbow surgery  2008    right  . Tonsillectomy  1992   History reviewed. No pertinent family history. Social History  Substance Use Topics  . Smoking status: Former Smoker -- 1.00 packs/day for 10 years    Types: Cigarettes    Quit date: 09/28/1975  . Smokeless tobacco: None  . Alcohol Use: Yes     Comment: a couple drinks a night    Review of Systems  Cardiovascular: Positive for chest pain.  All other systems reviewed and are negative.     Allergies  Review of patient's allergies indicates no known  allergies.  Home Medications   Prior to Admission medications   Medication Sig Start Date End Date Taking? Authorizing Provider  allopurinol (ZYLOPRIM) 100 MG tablet Take 500 mg by mouth 2 (two) times daily. Pt takes one tablet in AM and one tablet in PM.    Historical Provider, MD  atorvastatin (LIPITOR) 20 MG tablet Take 20 mg by mouth at bedtime.    Historical Provider, MD  furosemide (LASIX) 40 MG tablet Take 40 mg by mouth daily.    Historical Provider, MD  glimepiride (AMARYL) 2 MG tablet Take 2 mg by mouth daily before breakfast.    Historical Provider, MD  losartan (COZAAR) 50 MG tablet Take 50 mg by mouth daily.    Historical Provider, MD  Omega-3 Fatty Acids (FISH OIL) 1200 MG CAPS Take 1 capsule by mouth daily.    Historical Provider, MD   Pulse 68  Temp(Src) 98.4 F (36.9 C) (Oral)  Resp 23  Ht 5\' 7"  (1.702 m)  Wt 220 lb (99.791 kg)  BMI 34.45 kg/m2  SpO2 98% Physical Exam  Nursing note and vitals reviewed.  69 year old male, resting comfortably and in no acute distress. Vital signs are significant for tachypnea. Oxygen saturation is 98%, which is normal. Head is normocephalic and atraumatic. PERRLA, EOMI. Oropharynx is clear. Neck is  nontender and supple without adenopathy or JVD. Back is nontender and there is no CVA tenderness. Lungs have decreased air flow with decreased breath sounds at the right base and a few rales present at the right base. There are no wheezes or rhonchi. Chest is nontender. Heart has regular rate and rhythm without murmur. Abdomen is soft, flat, nontender without masses or hepatosplenomegaly and peristalsis is normoactive. Extremities have 2 edema, full range of motion is present. There is also 1+ presacral edema. Skin is warm and dry without rash. Neurologic: Mental status is normal, cranial nerves are intact, there are no motor or sensory deficits.  ED Course  Procedures (including critical care time) Labs Review Results for orders  placed or performed during the hospital encounter of 76/22/63  Basic metabolic panel  Result Value Ref Range   Sodium 133 (L) 135 - 145 mmol/L   Potassium 4.4 3.5 - 5.1 mmol/L   Chloride 97 (L) 101 - 111 mmol/L   CO2 26 22 - 32 mmol/L   Glucose, Bld 167 (H) 65 - 99 mg/dL   BUN 47 (H) 6 - 20 mg/dL   Creatinine, Ser 3.18 (H) 0.61 - 1.24 mg/dL   Calcium 8.9 8.9 - 10.3 mg/dL   GFR calc non Af Amer 18 (L) >60 mL/min   GFR calc Af Amer 21 (L) >60 mL/min   Anion gap 10 5 - 15  CBC  Result Value Ref Range   WBC 19.0 (H) 4.0 - 10.5 K/uL   RBC 2.92 (L) 4.22 - 5.81 MIL/uL   Hemoglobin 9.6 (L) 13.0 - 17.0 g/dL   HCT 29.0 (L) 39.0 - 52.0 %   MCV 99.3 78.0 - 100.0 fL   MCH 32.9 26.0 - 34.0 pg   MCHC 33.1 30.0 - 36.0 g/dL   RDW 14.2 11.5 - 15.5 %   Platelets 245 150 - 400 K/uL  Brain natriuretic peptide  Result Value Ref Range   B Natriuretic Peptide 228.8 (H) 0.0 - 100.0 pg/mL  Troponin I  Result Value Ref Range   Troponin I 0.08 (H) <0.031 ng/mL  Urinalysis, Routine w reflex microscopic (not at [facility])  Result Value Ref Range   Color, Urine YELLOW YELLOW   APPearance CLEAR CLEAR   Specific Gravity, Urine 1.016 1.005 - 1.030   pH 6.5 5.0 - 8.0   Glucose, UA NEGATIVE NEGATIVE mg/dL   Hgb urine dipstick TRACE (A) NEGATIVE   Bilirubin Urine NEGATIVE NEGATIVE   Ketones, ur NEGATIVE NEGATIVE mg/dL   Protein, ur 100 (A) NEGATIVE mg/dL   Urobilinogen, UA 0.2 0.0 - 1.0 mg/dL   Nitrite NEGATIVE NEGATIVE   Leukocytes, UA NEGATIVE NEGATIVE  Hepatic function panel  Result Value Ref Range   Total Protein 6.5 6.5 - 8.1 g/dL   Albumin 2.6 (L) 3.5 - 5.0 g/dL   AST 29 15 - 41 U/L   ALT 26 17 - 63 U/L   Alkaline Phosphatase 110 38 - 126 U/L   Total Bilirubin 0.4 0.3 - 1.2 mg/dL   Bilirubin, Direct 0.1 0.1 - 0.5 mg/dL   Indirect Bilirubin 0.3 0.3 - 0.9 mg/dL  Differential  Result Value Ref Range   Neutrophils Relative % 90 (H) 43 - 77 %   Neutro Abs 15.9 (H) 1.7 - 7.7 K/uL   Lymphocytes  Relative 3 (L) 12 - 46 %   Lymphs Abs 0.6 (L) 0.7 - 4.0 K/uL   Monocytes Relative 4 3 - 12 %   Monocytes Absolute 0.7 0.1 -  1.0 K/uL   Eosinophils Relative 3 0 - 5 %   Eosinophils Absolute 0.5 0.0 - 0.7 K/uL   Basophils Relative 0 0 - 1 %   Basophils Absolute 0.0 0.0 - 0.1 K/uL  Urine microscopic-add on  Result Value Ref Range   WBC, UA 0-2 <3 WBC/hpf   RBC / HPF 3-6 <3 RBC/hpf   Bacteria, UA RARE RARE  POC CBG, ED  Result Value Ref Range   Glucose-Capillary 171 (H) 65 - 99 mg/dL  I-stat troponin, ED  Result Value Ref Range   Troponin i, poc 0.00 0.00 - 0.08 ng/mL   Comment 3           Imaging Review Dg Chest 2 View  02/22/2015   CLINICAL DATA:  Chest tightness, shortness of breath for 1 week  EXAM: CHEST  2 VIEW  COMPARISON:  CT chest 11/06/2012  FINDINGS: There is no focal parenchymal opacity. There is no pleural effusion or pneumothorax. The heart and mediastinal contours are unremarkable.  There is mild thoracic spine spondylosis.  IMPRESSION: No active cardiopulmonary disease.   Electronically Signed   By: Kathreen Devoid   On: 68/05/5725 20:35   I, Alexey Rhoads, personally reviewed and evaluated these images and lab results as part of my medical decision-making.   EKG Interpretation   Date/Time:  Saturday February 22 2015 05:47:04 EDT Ventricular Rate:  73 PR Interval:  71 QRS Duration: 82 QT Interval:  399 QTC Calculation: 440 R Axis:   19 Text Interpretation:  Sinus rhythm Short PR interval Low voltage,  precordial leads Probable anteroseptal infarct, old Baseline wander in  lead(s) V2 When compared with ECG of 10/09/2007, No significant change was  found Confirmed by [facility]  MD, Earlyne Feeser ([id number]) on 02/22/2015 5:52:17 AM      MDM   Final diagnoses:  Acute on chronic renal failure  Dyspnea  Elevated troponin  Leukocytosis  Normochromic normocytic anemia    Physical findings suggestive of congestive heart failure with leg swelling and rales at the right base. She ECG  is unchanged. Screening labs and chest x-ray are obtained.  WBC is significantly elevated at 19.0, and hemoglobin is low at 9.6. This is somewhat below his baseline according to old records. It appears that his hemoglobin generally runs about 10.8.  Chest x-ray does not show pulmonary edema. BNP is only modestly elevated. Creatinine has increased modestly over baseline and albumen is noted to be low which is also contributing to his edema. Troponin is come back slightly elevated and this is felt to be demand ischemia. Case is discussed with Dr. Waldron [facility] who agrees to admit the patient.     Delora Fuel, MD 63/84/53 [id number]

## 2015-02-22 NOTE — H&P (Addendum)
Patient Demographics  Eugene [name], is a 69 y.o. male  MRN: [id number]   DOB - 10/21/1945  Admit Date - 02/22/2015  Outpatient Primary MD for the patient is Gara Kroner, MD   With History of -  Past Medical History  Diagnosis Date  . Cancer     rectal ca  . Hypertension   . Type 2 diabetes mellitus   . Anemia   . Renal disorder       Past Surgical History  Procedure Laterality Date  . Elbow surgery  2008    right  . Tonsillectomy  1992    in for   Chief Complaint  Patient presents with  . Chest Pain     HPI  Eugene [name]  is a 69 y.o. male, with past medical history of chronic kidney disease, rectal cancer in remission, type 2 diabetes mellitus, anemia, hypertension, patient is legally blind, patient presents with multiple complaints, reports he has not been feeling well for a week, reports generalized weakness, lower extremity edema, shortness of breath, and chest tightness, as well reports he had Scratched last week, started on Augmentin by his PCP, reportedly saw Dr. Justin Mend recently adjusted  his antihypertensives medication, kept him on same dose Lasix. - In ED workup was significant for worsening renal failure, with creatinine of 3.18, baseline is 2.7, mildly elevated troponin at 0.8, no baseline troponins, EKG nonacute, leukocytosis of 19,000, but negative urinalysis, chest x-ray.    Review of Systems    In addition to the HPI above, No Fever-chills, No Headache, patient is legally blind No problems swallowing food or Liquids, Reports chest tightness, and shortness of breath, denies cough or productive sputum. No Abdominal pain, No Nausea or Vommitting, regular bowel movement, reports feeling gassy. No Blood in stool or Urine, No dysuria, No new skin rashes or bruises, No new joints pains-aches,  No new weakness, tingling, numbness in any extremity, No recent weight gain or loss, No polyuria, polydypsia or polyphagia, No significant Mental  Stressors.  A full 10 point Review of Systems was done, except as stated above, all other Review of Systems were negative.   Social History Social History  Substance Use Topics  . Smoking status: Former Smoker -- 1.00 packs/day for 10 years    Types: Cigarettes    Quit date: 09/28/1975  . Smokeless tobacco: Not on file  . Alcohol Use: Yes     Comment: a couple drinks a night     Family History History reviewed. No pertinent family history. Carotid artery disease in father, negative for kidney disease  Prior to Admission medications   Medication Sig Start Date End Date Taking? Authorizing Provider  acetaminophen (TYLENOL) 325 MG tablet Take 650 mg by mouth every 6 (six) hours as needed for mild pain.   Yes Historical Provider, MD  allopurinol (ZYLOPRIM) 100 MG tablet Take 500 mg by mouth 2 (two) times daily. Pt takes one tablet in AM and one tablet in PM.   Yes Historical Provider, MD  amoxicillin-clavulanate (AUGMENTIN) 500-125 MG per tablet Take 1 tablet by mouth 2 (two) times daily. 02/15/15  Yes Historical Provider, MD  atorvastatin (LIPITOR) 20 MG tablet Take 20 mg by mouth at bedtime.   Yes Historical Provider, MD  furosemide (LASIX) 40 MG tablet Take 40 mg by mouth daily.   Yes Historical Provider, MD  glimepiride (AMARYL) 2 MG tablet Take 2 mg by mouth daily before breakfast.   Yes Historical Provider, MD  labetalol (  NORMODYNE) 200 MG tablet Take 200 mg by mouth 2 (two) times daily.   Yes Historical Provider, MD  Multiple Vitamin (MULTIVITAMIN WITH MINERALS) TABS tablet Take 1 tablet by mouth daily.   Yes Historical Provider, MD  Omega-3 Fatty Acids (FISH OIL) 1200 MG CAPS Take 1 capsule by mouth daily.   Yes Historical Provider, MD    No Known Allergies  Physical Exam  Vitals  Blood pressure 158/59, pulse 71, temperature 98.4 F (36.9 C), temperature source Oral, resp. rate 21, height 5\' 7"  (1.702 m), weight 102.967 kg (227 lb), SpO2 95 %.   1. General obese male  lying in bed in NAD,    2. Normal affect and insight, Not Suicidal or Homicidal, Awake Alert, Oriented X 3.  3. No F.N deficits, ALL C.Nerves Intact, Strength 5/5 all 4 extremities, Sensation intact all 4 extremities, Plantars down going.  4. Ears appears Normal, legally blind.  5. Supple Neck, No ,  No Carotid Bruits.  6. Symmetrical Chest wall movement, Good air movement bilaterally,   7. RRR, No Gallops, Rubs or Murmurs, No Parasternal Heave.  8. Positive Bowel Sounds, Abdomen Soft, No tenderness, No organomegaly appriciated,No rebound -guarding or rigidity.  9.  No Cyanosis, Normal Skin Turgor, No Skin Rash or Bruise.  10. Good muscle tone,  joints appear normal , no effusions, Normal ROM. Lower extremity edema, left +3, right +2, area of Scratch is healed.  11. No Palpable Lymph Nodes in Neck or Axillae    Data Review  CBC  Recent Labs Lab 02/22/15 0557  WBC 19.0*  HGB 9.6*  HCT 29.0*  PLT 245  MCV 99.3  MCH 32.9  MCHC 33.1  RDW 14.2  LYMPHSABS 0.6*  MONOABS 0.7  EOSABS 0.5  BASOSABS 0.0   ------------------------------------------------------------------------------------------------------------------  Chemistries   Recent Labs Lab 02/22/15 0557  NA 133*  K 4.4  CL 97*  CO2 26  GLUCOSE 167*  BUN 47*  CREATININE 3.18*  CALCIUM 8.9  AST 29  ALT 26  ALKPHOS 110  BILITOT 0.4   ------------------------------------------------------------------------------------------------------------------ estimated creatinine clearance is 25.1 mL/min (by C-G formula based on Cr of 3.18). ------------------------------------------------------------------------------------------------------------------ No results for input(s): TSH, T4TOTAL, T3FREE, THYROIDAB in the last 72 hours.  Invalid input(s): FREET3   Coagulation profile No results for input(s): INR, PROTIME in the last 168  hours. ------------------------------------------------------------------------------------------------------------------- No results for input(s): DDIMER in the last 72 hours. -------------------------------------------------------------------------------------------------------------------  Cardiac Enzymes  Recent Labs Lab 02/22/15 0557  TROPONINI 0.08*   ------------------------------------------------------------------------------------------------------------------ Invalid input(s): POCBNP   ---------------------------------------------------------------------------------------------------------------  Urinalysis    Component Value Date/Time   COLORURINE YELLOW 02/22/2015 0635   APPEARANCEUR CLEAR 02/22/2015 0635   LABSPEC 1.016 02/22/2015 0635   PHURINE 6.5 02/22/2015 0635   GLUCOSEU NEGATIVE 02/22/2015 0635   HGBUR TRACE* 02/22/2015 0635   BILIRUBINUR NEGATIVE 02/22/2015 0635   KETONESUR NEGATIVE 02/22/2015 0635   PROTEINUR 100* 02/22/2015 0635   UROBILINOGEN 0.2 02/22/2015 0635   NITRITE NEGATIVE 02/22/2015 0635   LEUKOCYTESUR NEGATIVE 02/22/2015 0635    ----------------------------------------------------------------------------------------------------------------  Imaging results:   Dg Chest 2 View  02/22/2015   CLINICAL DATA:  Chest tightness, shortness of breath for 1 week  EXAM: CHEST  2 VIEW  COMPARISON:  CT chest 11/06/2012  FINDINGS: There is no focal parenchymal opacity. There is no pleural effusion or pneumothorax. The heart and mediastinal contours are unremarkable.  There is mild thoracic spine spondylosis.  IMPRESSION: No active cardiopulmonary disease.   Electronically Signed   By: Elbert Ewings  Posey Pronto   On: 02/22/2015 07:30    My personal review of EKG: Rhythm NSR, Rate 73  /min, QTc 440 , no Acute ST changes    Assessment & Plan  Active Problems:   Rectum cancer   Hypertension   Diabetes mellitus   Renal insufficiency   High cholesterol    Leukocytosis   Lower extremity edema   Acute on chronic renal failure   Elevated troponin   Acute on chronic kidney disease - Patient with worsening creatinine baseline 2.7, today is 3.18. - With signs of volume overload, will continue with home dose Lasix 40 mg oral daily. - Consulted nephrology for further guidance for diuresis giving his volume overload status, and worsening renal function.  Chest tightness with elevated troponins - His chest tightness sounds nontypical, as he describes it as part of generalized body ache, abdominal discomfort, lower back pain. -  Will admit to telemetry, check 2-D echo ,continue to cycle his cardiac enzymes and follow the trend, if troponins trending up, will consult cardiology. - EKG nonacute, mildly elevated troponin most likely related to his chronic kidney disease. - We will start on aspirin, codeine beta blockers, Fish oil,  no ACE inhibitor or ARB in the setting of his renal failure.,  Abdomen discomfort - Will check abdominal x-ray, will start on laxatives.  Leukocytosis - Afebrile, negative urinalysis, negative chest x-ray, blood cultures were sent in ED. - No signs of active infection, his cat scratch appears to be improving, and healing nicely, continue with his oral Augmentin for that.  Diabetes mellitus - Hold Amaryl, continue with insulin sliding scale  Hypertension - Continue with labetalol  Hyperlipidemia - Continue with home medication  Lower extremity edema - In the setting of volume overload, likely related to chronic kidney disease. - No previous diagnosis of CHF, no echo done in the past, will obtain 2-D echo to evaluate for possible CHF etiology. - Will obtain venous Doppler to rule out DVT as left> right swelling (chronic as per wife)   DVT Prophylaxis Heparin   AM Labs Ordered, also please review Full Orders  Family Communication: Admission, patients condition and plan of care including tests being ordered have  been discussed with the patient and wife who indicate understanding and agree with the plan and Code Status.  Code Status DNR  Likely DC to  home  Condition GUARDED   Time spent in minutes : 60 minutes    Abbee Cremeens M.D on 02/22/2015 at 8:36 AM  Between 7am to 7pm - Pager - [phone]  After 7pm go to [email or web address] - password [email or web address]  And look for the night coverage person covering me after hours  [facility] Office  [phone]

## 2015-02-22 NOTE — Consult Note (Signed)
Renal Service Consult Note [facility]  OZELL JUHASZ 02/22/2015 Roney Jaffe D Requesting Physician:  Dr Izora Gala  Reason for Consult:  CKD patient with chest tightness HPI: The patient is a 69 y.o. 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 Also increased leg edema and SOB, not able to sleep. WBC was high at 19k and pt was admitted. Was put on abx for cat scratch related infection last week (Augmentin). No active diarrhea but has chronically loose stools.   Patient's had surgery / ostomy / chemoRx back in 2009 for dx of rectal cancer. Also had 2 CT scans w contrast around that time that patient believes may have damaged his kidneys. Looking back baseline creatinine back to 2009 at best was 1.5 - 1.7.  Then in 2010 had episode of dehydration with peak creat 6 which improved down to 2.6.  Since then creat has been between 2.6 and 3.0.  On admission this am creat was 3.10. He saw Dr Justin Mend w nephrology last week for first visit and labetalol was started and his losartan and amlodipine were stopped at the same time which was about 9 days ago. BP here is good.    ROS  no sore throat  no sig diarrhea but does have loose stools in diaper at baseline  no fevers,  chills  no abd pain , n/v  no joint pain or swelling  no HA or confusion  Past Medical History  Past Medical History  Diagnosis Date  . Cancer     rectal ca  . Hypertension   . Type 2 diabetes mellitus   . Anemia   . Renal disorder    Past Surgical History  Past Surgical History  Procedure Laterality Date  . Elbow surgery Right 2008    right-tendon and lipoma  . Tonsillectomy  1992  . Rectal surgery N/A 2009    rectal surgery for cancer  . Eye surgery Bilateral 1986    lens implant   Family History  Family History  Problem Relation Age of Onset  . Heart attack Mother   . Heart disease Father    Social History  reports  that he quit smoking about 39 years ago. His smoking use included Cigarettes. He has a 10 pack-year smoking history. He does not have any smokeless tobacco history on file. He reports that he drinks alcohol. He reports that he does not use illicit drugs. Allergies No Known Allergies Home medications Prior to Admission medications   Medication Sig Start Date End Date Taking? Authorizing Provider  acetaminophen (TYLENOL) 325 MG tablet Take 650 mg by mouth every 6 (six) hours as needed for mild pain.   Yes Historical Provider, MD  allopurinol (ZYLOPRIM) 100 MG tablet Take 500 mg by mouth 2 (two) times daily. Pt takes one tablet in AM and one tablet in PM.   Yes Historical Provider, MD  amoxicillin-clavulanate (AUGMENTIN) 500-125 MG per tablet Take 1 tablet by mouth 2 (two) times daily. 02/15/15  Yes Historical Provider, MD  atorvastatin (LIPITOR) 20 MG tablet Take 20 mg by mouth at bedtime.   Yes Historical Provider, MD  furosemide (LASIX) 40 MG tablet Take 40 mg by mouth daily.   Yes Historical Provider, MD  glimepiride (AMARYL) 2 MG tablet Take 2 mg by mouth daily before breakfast.   Yes Historical Provider, MD  labetalol (NORMODYNE) 200 MG tablet Take 200 mg by mouth 2 (two) times  daily.   Yes Historical Provider, MD  Multiple Vitamin (MULTIVITAMIN WITH MINERALS) TABS tablet Take 1 tablet by mouth daily.   Yes Historical Provider, MD  Omega-3 Fatty Acids (FISH OIL) 1200 MG CAPS Take 1 capsule by mouth daily.   Yes Historical Provider, MD   Liver Function Tests  Recent Labs Lab 02/22/15 0557  AST 29  ALT 26  ALKPHOS 110  BILITOT 0.4  PROT 6.5  ALBUMIN 2.6*   No results for input(s): LIPASE, AMYLASE in the last 168 hours. CBC  Recent Labs Lab 02/22/15 0557 02/22/15 1445  WBC 19.0* 17.4*  NEUTROABS 15.9*  --   HGB 9.6* 9.4*  HCT 29.0* 27.9*  MCV 99.3 98.6  PLT 245 093   Basic Metabolic Panel  Recent Labs Lab 02/22/15 0557 02/22/15 1445  NA 133*  --   K 4.4  --   CL 97*  --    CO2 26  --   GLUCOSE 167*  --   BUN 47*  --   CREATININE 3.18* 3.08*  CALCIUM 8.9  --     Filed Vitals:   02/22/15 0900 02/22/15 0948 02/22/15 1014 02/22/15 1622  BP: 148/44 154/64  156/61  Pulse: 72 76  70  Temp:  98.4 F (36.9 C)  98.4 F (36.9 C)  TempSrc:  Oral  Oral  Resp: 23 20  17   Height:   5\' 7"  (1.702 m)   Weight:   102.967 kg (227 lb)   SpO2: 96% 98%  94%   Exam Alert, blind, no distress No rash, cyanosis or gangrene Sclera anicteric, throat clear No jvd, flat neck veins Chest is clear bilat RRR no MRG Abd soft ntnd no mass or ascites, no hsm, obese GU normal male, in diapers 1+ pitting LE edema bilat pedal / pretib Neuro is alert, ox 3  UA - negative CXR possible early vasc congestion L side, read as negative by radiology Renal US last week , no hydro, bilat normal echo kidneys   Assessment: 1. 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 Agree with diuresis as you're doing, having a good response to 40 mg IV lasix. Will continue at 40 q 12 hrs.  Would continue labetalol.  2. Duncan Dull - consider checking for Cdif, don't see any active infectino on exam 3. 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 DM 2 oral agents, 10 yr hx no complications 6. HTN on labetalol only now   Plan- as above will follow.   Kelly Splinter MD (pgr) [phone]    ([phone] 02/22/2015, 4:52 PM

## 2015-02-23 ENCOUNTER — Inpatient Hospital Stay (HOSPITAL_COMMUNITY): Payer: Medicare Other

## 2015-02-23 DIAGNOSIS — R7989 Other specified abnormal findings of blood chemistry: Secondary | ICD-10-CM

## 2015-02-23 DIAGNOSIS — I5033 Acute on chronic diastolic (congestive) heart failure: Secondary | ICD-10-CM

## 2015-02-23 DIAGNOSIS — E780 Pure hypercholesterolemia: Secondary | ICD-10-CM

## 2015-02-23 DIAGNOSIS — E119 Type 2 diabetes mellitus without complications: Secondary | ICD-10-CM

## 2015-02-23 DIAGNOSIS — N189 Chronic kidney disease, unspecified: Secondary | ICD-10-CM

## 2015-02-23 DIAGNOSIS — N179 Acute kidney failure, unspecified: Principal | ICD-10-CM

## 2015-02-23 DIAGNOSIS — R609 Edema, unspecified: Secondary | ICD-10-CM

## 2015-02-23 LAB — BASIC METABOLIC PANEL
ANION GAP: 12 (ref 5–15)
BUN: 44 mg/dL — AB (ref 6–20)
CALCIUM: 8.5 mg/dL — AB (ref 8.9–10.3)
CO2: 25 mmol/L (ref 22–32)
Chloride: 98 mmol/L — ABNORMAL LOW (ref 101–111)
Creatinine, Ser: 3.01 mg/dL — ABNORMAL HIGH (ref 0.61–1.24)
GFR calc Af Amer: 23 mL/min — ABNORMAL LOW (ref >60)
GFR calc non Af Amer: 20 mL/min — ABNORMAL LOW (ref >60)
GLUCOSE: 128 mg/dL — AB (ref 65–99)
Potassium: 4 mmol/L (ref 3.5–5.1)
Sodium: 135 mmol/L (ref 135–145)

## 2015-02-23 LAB — CBC
HEMATOCRIT: 27.7 % — AB (ref 39.0–52.0)
HEMOGLOBIN: 9 g/dL — AB (ref 13.0–17.0)
MCH: 32.1 pg (ref 26.0–34.0)
MCHC: 32.5 g/dL (ref 30.0–36.0)
MCV: 98.9 fL (ref 78.0–100.0)
Platelets: 254 10*3/uL (ref 150–400)
RBC: 2.8 MIL/uL — AB (ref 4.22–5.81)
RDW: 14.1 % (ref 11.5–15.5)
WBC: 15.5 10*3/uL — ABNORMAL HIGH (ref 4.0–10.5)

## 2015-02-23 LAB — GLUCOSE, CAPILLARY
GLUCOSE-CAPILLARY: 215 mg/dL — AB (ref 65–99)
GLUCOSE-CAPILLARY: 199 mg/dL — AB (ref 65–99)
Glucose-Capillary: 113 mg/dL — ABNORMAL HIGH (ref 65–99)
Glucose-Capillary: 235 mg/dL — ABNORMAL HIGH (ref 65–99)

## 2015-02-23 LAB — TROPONIN I
TROPONIN I: 1.62 ng/mL — AB (ref <0.031)
Troponin I: 0.4 ng/mL — ABNORMAL HIGH (ref <0.031)

## 2015-02-23 MED ORDER — FUROSEMIDE 10 MG/ML IJ SOLN
40.0000 mg | Freq: Three times a day (TID) | INTRAMUSCULAR | Status: DC
  Administered 2015-02-23 – 2015-02-24 (×4): 40 mg via INTRAVENOUS
  Filled 2015-02-23 (×4): qty 4

## 2015-02-23 NOTE — Progress Notes (Signed)
[facility] PROGRESS NOTE  DESMEN SCHOFFSTALL WYO:[id number] DOB: 1946/05/05 DOA: 02/22/2015 PCP: Gara Kroner, MD  Assessment/Plan:  Acute on chronic kidney disease Patient with worsening creatinine baseline 2.7, today is 3.01, nephrology consulted. Continue Lasix 40 mg IV Q 12 hr. Check bmp in am.  Chest tightness with elevated troponin Patient came with chest tightness, cardiac enzymes now elevated last trop is 1.62 2  Echo is pending, EKG showed no acute finding. Started on aspirin, codeine beta blockers, Fish oil, no ACE inhibitor or ARB in the setting of his renal failure. Cardiology consulted, called Dr Gwen Her, who will see the patient today.  Abdomen discomfort Abdomen xray is  Negative for obstruction.  Leukocytosis Patient has negative UA, CXR is normal. Patient on Augmentin for cat scratch disease  Diabetes mellitus  Hold Amaryl, continue with insulin sliding scale  Hypertension  Continue with labetalol  Hyperlipidemia  Continue with Lipitor  Lower extremity edema Lower extremity duplex is negative for DVT  DVT Prophylaxis  Heparin   Code Status: DNR Family Communication: *Discussed with wife at bedside Disposition Plan:  To be decided   Consultants:  Cardiology   Procedures:  None  Antibiotics:  None  HPI/Subjective: 69 y.o. male, with past medical history of chronic kidney disease, rectal cancer in remission, type 2 diabetes mellitus, anemia, hypertension, patient is legally blind, patient presents with multiple complaints, reports he has not been feeling well for a week, reports generalized weakness, lower extremity edema, shortness of breath, and chest tightness, as well reports he had Scratched last week, started on Augmentin by his PCP, reportedly saw Dr. Justin Mend recently adjusted his antihypertensives medication, kept him on same dose Lasix.  Today  he denies any pain, trop was elevated to 0.40, repeat troponin in the hospital is  1.62  Objective: Filed Vitals:   02/23/15 0727  BP: 160/64  Pulse: 69  Temp: 98.5 F (36.9 C)  Resp: 17    Intake/Output Summary (Last 24 hours) at 02/23/15 1450 Last data filed at 02/23/15 1332  Gross per 24 hour  Intake    720 ml  Output   3750 ml  Net  -3030 ml   Filed Weights   02/22/15 0630 02/22/15 1014 02/22/15 2043  Weight: 102.967 kg (227 lb) 102.967 kg (227 lb) 101.56 kg (223 lb 14.4 oz)    Exam:   General:  Appears in no acute distress  Cardiovascular: S1S2 RRR  Respiratory: Clear bilaterally  Abdomen: Soft, non tender, no organomegaly  Musculoskeletal: *No edema of the lower extremities  Data Reviewed: Basic Metabolic Panel:  Recent Labs Lab 02/22/15 0557 02/22/15 1445 02/23/15 0507  NA 133*  --  135  K 4.4  --  4.0  CL 97*  --  98*  CO2 26  --  25  GLUCOSE 167*  --  128*  BUN 47*  --  44*  CREATININE 3.18* 3.08* 3.01*  CALCIUM 8.9  --  8.5*   Liver Function Tests:  Recent Labs Lab 02/22/15 0557  AST 29  ALT 26  ALKPHOS 110  BILITOT 0.4  PROT 6.5  ALBUMIN 2.6*   CBC:  Recent Labs Lab 02/22/15 0557 02/22/15 1445 02/23/15 0507  WBC 19.0* 17.4* 15.5*  NEUTROABS 15.9*  --   --   HGB 9.6* 9.4* 9.0*  HCT 29.0* 27.9* 27.7*  MCV 99.3 98.6 98.9  PLT 245 235 254   Cardiac Enzymes:  Recent Labs Lab 02/22/15 0557 02/22/15 1445 02/22/15 2359 02/23/15 1320  TROPONINI  0.08* <0.03 0.40* 1.62*   BNP (last 3 results)  Recent Labs  02/22/15 0557  BNP 228.8*    ProBNP (last 3 results) No results for input(s): PROBNP in the last 8760 hours.  CBG:  Recent Labs Lab 02/22/15 1142 02/22/15 1620 02/22/15 2041 02/23/15 0726 02/23/15 1124  GLUCAP 159* 164* 206* 113* 215*    Recent Results (from the past 240 hour(s))  Blood culture (routine x 2)     Status: None (Preliminary result)   Collection Time: 02/22/15  8:14 AM  Result Value Ref Range Status   Specimen Description BLOOD LEFT ANTECUBITAL  Final   Special  Requests BOTTLES DRAWN AEROBIC AND ANAEROBIC 10CC  Final   Culture PENDING  Incomplete   Report Status PENDING  Incomplete     Studies: Dg Chest 2 View  02/22/2015   CLINICAL DATA:  Chest tightness, shortness of breath for 1 week  EXAM: CHEST  2 VIEW  COMPARISON:  CT chest 11/06/2012  FINDINGS: There is no focal parenchymal opacity. There is no pleural effusion or pneumothorax. The heart and mediastinal contours are unremarkable.  There is mild thoracic spine spondylosis.  IMPRESSION: No active cardiopulmonary disease.   Electronically Signed   By: Kathreen Devoid   On: 02/22/2015 07:30   Dg Abd 2 Views  02/22/2015   CLINICAL DATA:  Abdominal bloating.  EXAM: ABDOMEN - 2 VIEW  COMPARISON:  None.  FINDINGS: The bowel gas pattern is normal. There is no evidence of free air. No radio-opaque calculi or other significant radiographic abnormality is seen.  IMPRESSION: No evidence of bowel obstruction or ileus.   Electronically Signed   By: Marijo Conception, M.D.   On: 02/22/2015 09:38    Scheduled Meds: . allopurinol  500 mg Oral BID  . amoxicillin-clavulanate  1 tablet Oral BID  . aspirin EC  81 mg Oral Daily  . atorvastatin  20 mg Oral QHS  . furosemide  40 mg Intravenous Q12H  . heparin  5,000 Units Subcutaneous 3 times per day  . insulin aspart  0-9 Units Subcutaneous TID WC  . labetalol  200 mg Oral BID  . multivitamin with minerals  1 tablet Oral Daily  . omega-3 acid ethyl esters  1 g Oral Daily  . polyethylene glycol  17 g Oral Daily  . sodium chloride  3 mL Intravenous Q12H   Continuous Infusions:   Active Problems:   Rectum cancer   Hypertension   Diabetes mellitus   Renal insufficiency   High cholesterol   Leukocytosis   Lower extremity edema   Acute on chronic renal failure   Elevated troponin    Time spent: 25 min*    [facility]  [facility] Pager [phone]. If 7PM-7AM, please contact night-coverage at [email or web address], password [facility] 02/23/2015, 2:50 PM  LOS:  1 day

## 2015-02-23 NOTE — Progress Notes (Signed)
Lab called with elevated troponin of 1.62. Dr. Darrick Meigs informed.  MD states that he will put in cardio consult.

## 2015-02-23 NOTE — Evaluation (Signed)
Physical Therapy Evaluation Patient Details Name: Eugene [name] MRN: [id number] DOB: 08-05-1945 Today's Date: 02/23/2015   History of Present Illness  Eugene [name] is a 69 y.o. male, with past medical history of chronic kidney disease, rectal cancer in remission, type 2 diabetes mellitus, anemia, hypertension, patient is legally blind, patient presents with multiple complaints, reports he has not been feeling well for a week, reports generalized weakness, lower extremity edema, shortness of breath, and chest tightness, as well reports he had Scratched last week, started on Augmentin by his PCP  Clinical Impression   Patient evaluated by Physical Therapy with no further acute PT needs identified. All education has been completed and the patient has no further questions.  PT is signing off. Thank you for this referral.     Follow Up Recommendations No PT follow up    Equipment Recommendations  None recommended by PT (discussed using his cane prn)    Recommendations for Other Services       Precautions / Restrictions Precautions Precaution Comments: Legally Blind, needs help for pathfinding in unfamiliar hospital environment      Mobility  Bed Mobility Overal bed mobility: Modified Independent                Transfers Overall transfer level: Modified independent Equipment used: None             General transfer comment: Uses UEs to push off; at baseline  Ambulation/Gait Ambulation/Gait assistance: Supervision;Modified independent (Device/Increase time) Ambulation Distance (Feet): 300 Feet Assistive device: None (and pushing Dinamap) Gait Pattern/deviations: Wide base of support   Gait velocity interpretation: Below normal speed for age/gender General Gait Details: Pt reports he feels he is at baseline  Stairs            Wheelchair Mobility    Modified Rankin (Stroke Patients Only)       Balance                                              Pertinent Vitals/Pain Pain Assessment: No/denies pain    Home Living Family/patient expects to be discharged to:: Private residence Living Arrangements: Spouse/significant other Available Help at Discharge: Family;Available PRN/intermittently Type of Home: House Home Access: Level entry     Home Layout: One level Home Equipment: Cane - single point      Prior Function Level of Independence: Independent               Hand Dominance        Extremity/Trunk Assessment   Upper Extremity Assessment: Overall WFL for tasks assessed           Lower Extremity Assessment: Overall WFL for tasks assessed         Communication   Communication: No difficulties  Cognition Arousal/Alertness: Awake/alert Behavior During Therapy: WFL for tasks assessed/performed Overall Cognitive Status: Within Functional Limits for tasks assessed                      General Comments General comments (skin integrity, edema, etc.): Session conducted on Room Air and O2 sats remained greater than or equal to 93%    Exercises        Assessment/Plan    PT Assessment Patent does not need any further PT services  PT Diagnosis Generalized weakness   PT Problem List    PT  Treatment Interventions     PT Goals (Current goals can be found in the Care Plan section) Acute Rehab PT Goals Patient Stated Goal: Hopes to be home soon PT Goal Formulation: All assessment and education complete, DC therapy    Frequency     Barriers to discharge        Co-evaluation               End of Session Equipment Utilized During Treatment: Other (comment) (Dinamap) Activity Tolerance: Patient tolerated treatment well Patient left: in bed;with call bell/phone within reach;with family/visitor present (sittign EOB) Nurse Communication: Mobility status         Time: 8727-6184 PT Time Calculation (min) (ACUTE ONLY): 26 min   Charges:   PT Evaluation $Initial PT  Evaluation Tier I: 1 Procedure PT Treatments $Gait Training: 8-22 mins   PT G CodesQuin Hoop 02/23/2015, 1:27 PM  Roney Marion, [place] Pager [phone] Office [phone]

## 2015-02-23 NOTE — Consult Note (Addendum)
CARDIOLOGY CONSULT NOTE Referring Physician: [facility]: New Reason for Consultation: CP, + troponin   HPI: Eugene [name] is a 69 y.o. 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 We are asked to see due to CP and elevated troponin.   He denies any h/o known cardiac disease. Had routine stress test over 10 years ago which was negative. Has had CKD for several years with creatinine in mid 2s. About 3 months ago his doctor started him on amlodipine for poorly-controlled HTN. Began to develop swelling and progressive dyspnea. Noted that when ever he would do his activities he would be more SOB and then chest would be heavy. Gained about 25 pounds. Presented to ER due to these symptoms and creatinine found to be 3.2. With troponin 1.6. Symptoms now resolved with diuresis.   Wife reports he snores heavily and has periods of apnea nightly.   ECG with NSR 73 No ST-T wave abnormalities.    Review of Systems:     Cardiac Review of Systems: {Y] = yes [ ]  = no  Chest Pain [  y  ]  Resting SOB [ y  ] Exertional SOB  [ y ]  Orthopnea [  ]   Pedal Edema [ y  ]    Palpitations [  ] Syncope  [  ]   Presyncope [   ]  General Review of Systems: [Y] = yes [  ]=no Constitional: recent weight change Blue.Reese  ]; anorexia [  ]; fatigue [ y ]; nausea [  ]; night sweats [  ]; fever [  ]; or chills [  ];                                     Eyes : blurred vision [  ]; diplopia [   ]; vision changes [  ];  Amaurosis fugax[  ]; Resp: cough [  ];  wheezing[  ];  hemoptysis[  ];  PND [  ];  GI:  gallstones[  ], vomiting[  ];  dysphagia[  ]; melena[  ];  hematochezia [  ]; heartburn[  ];   GU: kidney stones [  ]; hematuria[  ];   dysuria [  ];  nocturia[  ]; incontinence [  ];             Skin: rash, swelling[  ];, hair loss[  ];  peripheral edema[  ];  or itching[   ]; Musculosketetal: myalgias[  ];  joint swelling[  ];  joint erythema[  ];  joint pain[y  ];  back pain[y  ];  Heme/Lymph: bruising[  ];  bleeding[  ];  anemia[  ];  Neuro: TIA[  ];  headaches[  ];  stroke[  ];  vertigo[  ];  seizures[  ];   paresthesias[  ];  difficulty walking[  ];  Psych:depression[  ]; anxiety[  ];  Endocrine: diabetes[ y ];  thyroid dysfunction[  ];  Other:  Past Medical History  Diagnosis Date  . Cancer     rectal ca  . Hypertension   . Type 2 diabetes mellitus   . Anemia   . Renal disorder     Medications Prior to Admission  Medication Sig Dispense Refill  . acetaminophen (TYLENOL) 325 MG tablet Take  650 mg by mouth every 6 (six) hours as needed for mild pain.    Marland Kitchen allopurinol (ZYLOPRIM) 100 MG tablet Take 500 mg by mouth 2 (two) times daily. Pt takes one tablet in AM and one tablet in PM.    . amoxicillin-clavulanate (AUGMENTIN) 500-125 MG per tablet Take 1 tablet by mouth 2 (two) times daily.  0  . atorvastatin (LIPITOR) 20 MG tablet Take 20 mg by mouth at bedtime.    . furosemide (LASIX) 40 MG tablet Take 40 mg by mouth daily.    Marland Kitchen glimepiride (AMARYL) 2 MG tablet Take 2 mg by mouth daily before breakfast.    . labetalol (NORMODYNE) 200 MG tablet Take 200 mg by mouth 2 (two) times daily.    . Multiple Vitamin (MULTIVITAMIN WITH MINERALS) TABS tablet Take 1 tablet by mouth daily.    . Omega-3 Fatty Acids (FISH OIL) 1200 MG CAPS Take 1 capsule by mouth daily.       Marland Kitchen allopurinol  500 mg Oral BID  . amoxicillin-clavulanate  1 tablet Oral BID  . aspirin EC  81 mg Oral Daily  . atorvastatin  20 mg Oral QHS  . furosemide  40 mg Intravenous Q12H  . heparin  5,000 Units Subcutaneous 3 times per day  . insulin aspart  0-9 Units Subcutaneous TID WC  . labetalol  200 mg Oral BID  . multivitamin with minerals  1 tablet Oral Daily  . omega-3 acid ethyl esters  1 g Oral Daily  . polyethylene glycol  17 g Oral Daily  . sodium chloride  3 mL Intravenous Q12H     Infusions:    No Known Allergies  Social History   Social History  . Marital Status: Married    Spouse Name: N/A  . Number of Children: N/A  . Years of Education: N/A   Occupational History  . Not on file.   Social History Main Topics  . Smoking status: Former Smoker -- 1.00 packs/day for 10 years    Types: Cigarettes    Quit date: 09/28/1975  . Smokeless tobacco: Not on file  . Alcohol Use: Yes     Comment: a couple drinks a night-bourbon (2 oz)  . Drug Use: No  . Sexual Activity: Yes    Birth Control/ Protection: None   Other Topics Concern  . Not on file   Social History Narrative    Family History  Problem Relation Age of Onset  . Heart attack Mother   . Heart disease Father     PHYSICAL EXAM: Filed Vitals:   02/23/15 0727  BP: 160/64  Pulse: 69  Temp: 98.5 F (36.9 C)  Resp: 17     Intake/Output Summary (Last 24 hours) at 02/23/15 1513 Last data filed at 02/23/15 1332  Gross per 24 hour  Intake    720 ml  Output   3750 ml  Net  -3030 ml    General:  Eugene [name]. Lying flat in bed. No respiratory difficulty HEENT: normal Neck: supple.thick  Unable to see JVP. Carotids 2+ bilat; no bruits. No lymphadenopathy or thryomegaly appreciated. Cor: PMI nondisplaced. Regular rate & rhythm. No rubs, gallops or murmurs. Lungs: clear Abdomen: obese soft, nontender, nondistended. No hepatosplenomegaly. No bruits or masses. Good bowel sounds. Extremities: no cyanosis, clubbing, rash, edema Neuro: alert & oriented x 3, cranial nerves grossly intact. moves all 4 extremities w/o difficulty. Affect pleasant.   Results for orders placed or performed during the hospital encounter of 02/22/15 (  from the past 24 hour(s))  Glucose, capillary     Status: Abnormal   Collection Time: 02/22/15  4:20 PM  Result Value Ref Range   Glucose-Capillary 164 (H) 65 - 99 mg/dL  Glucose, capillary     Status: Abnormal   Collection Time: 02/22/15  8:41 PM  Result Value Ref  Range   Glucose-Capillary 206 (H) 65 - 99 mg/dL  Troponin I (q 6hr x 3)     Status: Abnormal   Collection Time: 02/22/15 11:59 PM  Result Value Ref Range   Troponin I 0.40 (H) <0.031 ng/mL  Basic metabolic panel     Status: Abnormal   Collection Time: 02/23/15  5:07 AM  Result Value Ref Range   Sodium 135 135 - 145 mmol/L   Potassium 4.0 3.5 - 5.1 mmol/L   Chloride 98 (L) 101 - 111 mmol/L   CO2 25 22 - 32 mmol/L   Glucose, Bld 128 (H) 65 - 99 mg/dL   BUN 44 (H) 6 - 20 mg/dL   Creatinine, Ser 3.01 (H) 0.61 - 1.24 mg/dL   Calcium 8.5 (L) 8.9 - 10.3 mg/dL   GFR calc non Af Amer 20 (L) >60 mL/min   GFR calc Af Amer 23 (L) >60 mL/min   Anion gap 12 5 - 15  CBC     Status: Abnormal   Collection Time: 02/23/15  5:07 AM  Result Value Ref Range   WBC 15.5 (H) 4.0 - 10.5 K/uL   RBC 2.80 (L) 4.22 - 5.81 MIL/uL   Hemoglobin 9.0 (L) 13.0 - 17.0 g/dL   HCT 27.7 (L) 39.0 - 52.0 %   MCV 98.9 78.0 - 100.0 fL   MCH 32.1 26.0 - 34.0 pg   MCHC 32.5 30.0 - 36.0 g/dL   RDW 14.1 11.5 - 15.5 %   Platelets 254 150 - 400 K/uL  Glucose, capillary     Status: Abnormal   Collection Time: 02/23/15  7:26 AM  Result Value Ref Range   Glucose-Capillary 113 (H) 65 - 99 mg/dL  Glucose, capillary     Status: Abnormal   Collection Time: 02/23/15 11:24 AM  Result Value Ref Range   Glucose-Capillary 215 (H) 65 - 99 mg/dL  Troponin I (q 6hr x 3)     Status: Abnormal   Collection Time: 02/23/15  1:20 PM  Result Value Ref Range   Troponin I 1.62 (HH) <0.031 ng/mL   Dg Chest 2 View  02/22/2015   CLINICAL DATA:  Chest tightness, shortness of breath for 1 week  EXAM: CHEST  2 VIEW  COMPARISON:  CT chest 11/06/2012  FINDINGS: There is no focal parenchymal opacity. There is no pleural effusion or pneumothorax. The heart and mediastinal contours are unremarkable.  There is mild thoracic spine spondylosis.  IMPRESSION: No active cardiopulmonary disease.   Electronically Signed   By: Kathreen Devoid   On: 02/22/2015 07:30    Dg Abd 2 Views  02/22/2015   CLINICAL DATA:  Abdominal bloating.  EXAM: ABDOMEN - 2 VIEW  COMPARISON:  None.  FINDINGS: The bowel gas pattern is normal. There is no evidence of free air. No radio-opaque calculi or other significant radiographic abnormality is seen.  IMPRESSION: No evidence of bowel obstruction or ileus.   Electronically Signed   By: Marijo Conception, M.D.   On: 02/22/2015 09:38    ASSESSMENT: 1. Elevated troponin 2. Acute diastolic HF 3. Acute on chronic renal failure 4. DM2 x 10 years 5. 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 HTN, poorly controlled 7. Anemia of chronic disease  PLAN/DISCUSSION:  I suspect HF symptoms and elevated troponin likely due to volume overload in the setting of poorly-controlled HTN and progressive renal failure. However he does have multiple cardiac risk factors. Not candidate for cath due to CKD. Will check echo and Lexiscan Myoview. Would mak sure he is adequately diuresed with increasing diuretic doses as guided by the Renal Service. Continue to work on BP control. Can add clonidine or cardura as needed. By history has severe OSA and will need outpatient sleep study.   Continue ASA, statin.   Milady Fleener,MD 3:35 PM

## 2015-02-23 NOTE — Progress Notes (Signed)
Bilateral lower extremity venous duplex completed:  No evidence of DVT, superficial thrombosis, or Baker's cyst.   

## 2015-02-23 NOTE — Progress Notes (Signed)
  [facility] Progress Note   Subjective: *breathing better, legs less swollen per pt  Filed Vitals:   02/22/15 2043 02/23/15 0100 02/23/15 0430 02/23/15 0727  BP: 166/56  150/53 160/64  Pulse: 72  69 69  Temp: 98.2 F (36.8 C)  98.9 F (37.2 C) 98.5 F (36.9 C)  TempSrc: Oral  Oral Oral  Resp: 18  17 17   Height:      Weight: 101.56 kg (223 lb 14.4 oz)     SpO2: 95% 92% 95% 95%   Exam: Alert, blind, no distress No rash, cyanosis or gangrene Sclera anicteric, throat clear No jvd, flat neck veins Chest is clear bilat RRR no MRG Abd soft ntnd no mass or ascites, no hsm, obese GU normal male, in diapers 1+ pitting LE edema bilat pedal / pretib Neuro is alert, ox 3  UA - negative CXR possible early vasc congestion L side, read as negative by radiology Renal US last week , no hydro, bilat normal echo kidneys   Assessment: 1. 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  Diuresing some but still edematous. Have increased IV lasix to 40 every 8 hrs for now 2. ^WBC - improved, unclear cause 3. 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 DM 2 oral agents, 10 yr hx no complications 6. HTN on labetalol only now  Plan - cont to Theodosia Paling MD  pager [phone]    cell [phone]  02/23/2015, 3:57 PM     Recent Labs Lab 02/22/15 0557 02/22/15 1445 02/23/15 0507  NA 133*  --  135  K 4.4  --  4.0  CL 97*  --  98*  CO2 26  --  25  GLUCOSE 167*  --  128*  BUN 47*  --  44*  CREATININE 3.18* 3.08* 3.01*  CALCIUM 8.9  --  8.5*    Recent Labs Lab 02/22/15 0557  AST 29  ALT 26  ALKPHOS 110  BILITOT 0.4  PROT 6.5  ALBUMIN 2.6*    Recent Labs Lab 02/22/15 0557 02/22/15 1445 02/23/15 0507  WBC 19.0* 17.4* 15.5*  NEUTROABS 15.9*  --   --   HGB 9.6* 9.4* 9.0*  HCT 29.0* 27.9* 27.7*  MCV 99.3 98.6 98.9  PLT 245 235 254   . allopurinol  500 mg Oral BID  . amoxicillin-clavulanate  1  tablet Oral BID  . aspirin EC  81 mg Oral Daily  . atorvastatin  20 mg Oral QHS  . furosemide  40 mg Intravenous 3 times per day  . heparin  5,000 Units Subcutaneous 3 times per day  . insulin aspart  0-9 Units Subcutaneous TID WC  . labetalol  200 mg Oral BID  . multivitamin with minerals  1 tablet Oral Daily  . omega-3 acid ethyl esters  1 g Oral Daily  . polyethylene glycol  17 g Oral Daily  . sodium chloride  3 mL Intravenous Q12H     acetaminophen, bisacodyl, ondansetron **OR** ondansetron (ZOFRAN) IV, polyethylene glycol

## 2015-02-24 ENCOUNTER — Inpatient Hospital Stay (HOSPITAL_COMMUNITY): Payer: Medicare Other

## 2015-02-24 ENCOUNTER — Ambulatory Visit (HOSPITAL_BASED_OUTPATIENT_CLINIC_OR_DEPARTMENT_OTHER): Payer: Medicare Other

## 2015-02-24 ENCOUNTER — Encounter (HOSPITAL_COMMUNITY): Payer: Medicare Other

## 2015-02-24 DIAGNOSIS — R079 Chest pain, unspecified: Secondary | ICD-10-CM

## 2015-02-24 LAB — GLUCOSE, CAPILLARY
Glucose-Capillary: 142 mg/dL — ABNORMAL HIGH (ref 65–99)
Glucose-Capillary: 229 mg/dL — ABNORMAL HIGH (ref 65–99)
Glucose-Capillary: 223 mg/dL — ABNORMAL HIGH (ref 65–99)

## 2015-02-24 LAB — HEMOGLOBIN A1C
Hgb A1c MFr Bld: 6.5 % — ABNORMAL HIGH (ref 4.8–5.6)
Mean Plasma Glucose: 140 mg/dL

## 2015-02-24 MED ORDER — REGADENOSON 0.4 MG/5ML IV SOLN
0.4000 mg | Freq: Once | INTRAVENOUS | Status: DC
  Filled 2015-02-24: qty 5

## 2015-02-24 MED ORDER — TECHNETIUM TC 99M SESTAMIBI GENERIC - CARDIOLITE
30.0000 | Freq: Once | INTRAVENOUS | Status: AC | PRN
Start: 2015-02-24 — End: 2015-02-24
  Administered 2015-02-24: 30 via INTRAVENOUS

## 2015-02-24 MED ORDER — FUROSEMIDE 80 MG PO TABS
80.0000 mg | ORAL_TABLET | Freq: Every day | ORAL | Status: DC
  Administered 2015-02-25: 80 mg via ORAL
  Filled 2015-02-24 (×2): qty 1

## 2015-02-24 MED ORDER — TECHNETIUM TC 99M SESTAMIBI GENERIC - CARDIOLITE
10.0000 | Freq: Once | INTRAVENOUS | Status: AC | PRN
  Administered 2015-02-24: 10 via INTRAVENOUS

## 2015-02-24 MED ORDER — REGADENOSON 0.4 MG/5ML IV SOLN
INTRAVENOUS | Status: AC
  Filled 2015-02-24: qty 5

## 2015-02-24 NOTE — Progress Notes (Addendum)
    Subjective:  No chest pain this am, flat in bed for Myoview and comfortable.  Objective:  Vital Signs in the last 24 hours: Temp:  [98.5 F (36.9 C)-98.9 F (37.2 C)] 98.5 F (36.9 C) (08/15 0700) Pulse Rate:  [68-85] 85 (08/15 1104) Resp:  [17-18] 17 (08/15 0700) BP: (134-169)/(49-67) 169/67 mmHg (08/15 1104) SpO2:  [96 %-98 %] 98 % (08/15 0700) Weight:  [223 lb 15.8 oz (101.6 kg)] 223 lb 15.8 oz (101.6 kg) (08/14 2027)  Intake/Output from previous day:  Intake/Output Summary (Last 24 hours) at 02/24/15 1107 Last data filed at 02/24/15 1000  Gross per 24 hour  Intake    480 ml  Output   2100 ml  Net  -1620 ml    Physical Exam: General appearance: alert, cooperative, no distress and morbidly obese Lungs: clear to auscultation bilaterally Heart: regular rate and rhythm   Rate: 84  Rhythm: normal sinus rhythm  Lab Results:  Recent Labs  02/22/15 1445 02/23/15 0507  WBC 17.4* 15.5*  HGB 9.4* 9.0*  PLT 235 254    Recent Labs  02/22/15 0557 02/22/15 1445 02/23/15 0507  NA 133*  --  135  K 4.4  --  4.0  CL 97*  --  98*  CO2 26  --  25  GLUCOSE 167*  --  128*  BUN 47*  --  44*  CREATININE 3.18* 3.08* 3.01*    Recent Labs  02/22/15 2359 02/23/15 1320  TROPONINI 0.40* 1.62*   No results for input(s): INR in the last 72 hours.  Scheduled Meds: . allopurinol  500 mg Oral BID  . amoxicillin-clavulanate  1 tablet Oral BID  . aspirin EC  81 mg Oral Daily  . atorvastatin  20 mg Oral QHS  . furosemide  40 mg Intravenous 3 times per day  . heparin  5,000 Units Subcutaneous 3 times per day  . insulin aspart  0-9 Units Subcutaneous TID WC  . labetalol  200 mg Oral BID  . multivitamin with minerals  1 tablet Oral Daily  . omega-3 acid ethyl esters  1 g Oral Daily  . polyethylene glycol  17 g Oral Daily  . regadenoson      . regadenoson  0.4 mg Intravenous Once  . sodium chloride  3 mL Intravenous Q12H   Continuous Infusions:  PRN  Meds:.acetaminophen, bisacodyl, ondansetron **OR** ondansetron (ZOFRAN) IV, polyethylene glycol   Imaging: Imaging results have been reviewed   Assessment/Plan:   Active Problems:   Rectum cancer   Hypertension   Diabetes mellitus   Renal insufficiency   High cholesterol   Leukocytosis   Lower extremity edema   Acute on chronic renal failure   Elevated troponin   PLAN: Lexiscan Myoview tolerated well, final report pending.   Kerin Ransom PA-C 02/24/2015, 11:07 AM [phone]  I have examined the patient and reviewed assessment and plan and discussed with patient.  Agree with above as stated.  Not a good cath candidate due to renal insufficiency.  Await cardiolite results.  Anticipate medical therapy.  Elevated troponin.  May be related to CHF.  Echo pending.  Dionisios Ricci S.

## 2015-02-24 NOTE — Progress Notes (Signed)
[facility] PROGRESS NOTE  MARCELLIS FRAMPTON OIZ:[id number] DOB: 1945-08-26 DOA: 02/22/2015 PCP: Gara Kroner, MD  Assessment/Plan:  Acute on chronic kidney disease Patient with worsening creatinine baseline 2.7, today is 3.01, nephrology consulted. patient was started on Lasix 40 mg IV Q 12 hr. Lasix now has been changed to 80 mg daily. Will check BMP in a.m.  Chest tightness with elevated troponin Patient came with chest tightness, cardiac enzymes now elevated last trop is 1.62 2  Echo is pending, EKG showed no acute finding. Started on aspirin, codeine beta blockers, Fish oil, no ACE inhibitor or ARB in the setting of his renal failure. Patient underwent Cardiolite stress test today, results are pending.  Abdomen discomfort Abdomen xray is  Negative for obstruction.  Leukocytosis Patient has negative UA, CXR is normal. Patient on Augmentin for cat scratch disease  Diabetes mellitus  Hold Amaryl, continue with insulin sliding scale  Hypertension  Continue with labetalol  Hyperlipidemia  Continue with Lipitor  Lower extremity edema Lower extremity duplex is negative for DVT  DVT Prophylaxis  Heparin   Code Status: DNR Family Communication: Discussed with wife at bedside Disposition Plan:  To be decided   Consultants:  Cardiology   Procedures:  None  Antibiotics:  None  HPI/Subjective: 69 y.o. male, with past medical history of chronic kidney disease, rectal cancer in remission, type 2 diabetes mellitus, anemia, hypertension, patient is legally blind, patient presents with multiple complaints, reports he has not been feeling well for a week, reports generalized weakness, lower extremity edema, shortness of breath, and chest tightness, as well reports he had Scratched last week, started on Augmentin by his PCP, reportedly saw Dr. Justin Mend recently adjusted his antihypertensives medication, kept him on same dose Lasix.  Today patient is awaiting to go for  cardiac stress test. Denies chest pain.  Objective: Filed Vitals:   02/24/15 1347  BP: 121/56  Pulse: 77  Temp: 97.5 F (36.4 C)  Resp: 18    Intake/Output Summary (Last 24 hours) at 02/24/15 1453 Last data filed at 02/24/15 1000  Gross per 24 hour  Intake    240 ml  Output   1750 ml  Net  -1510 ml   Filed Weights   02/22/15 1014 02/22/15 2043 02/23/15 2027  Weight: 102.967 kg (227 lb) 101.56 kg (223 lb 14.4 oz) 101.6 kg (223 lb 15.8 oz)    Exam:   General:  Appears in no acute distress  Cardiovascular: S1S2 RRR  Respiratory: Clear bilaterally  Abdomen: Soft, non tender, no organomegaly  Musculoskeletal: *No edema of the lower extremities  Data Reviewed: Basic Metabolic Panel:  Recent Labs Lab 02/22/15 0557 02/22/15 1445 02/23/15 0507  NA 133*  --  135  K 4.4  --  4.0  CL 97*  --  98*  CO2 26  --  25  GLUCOSE 167*  --  128*  BUN 47*  --  44*  CREATININE 3.18* 3.08* 3.01*  CALCIUM 8.9  --  8.5*   Liver Function Tests:  Recent Labs Lab 02/22/15 0557  AST 29  ALT 26  ALKPHOS 110  BILITOT 0.4  PROT 6.5  ALBUMIN 2.6*   CBC:  Recent Labs Lab 02/22/15 0557 02/22/15 1445 02/23/15 0507  WBC 19.0* 17.4* 15.5*  NEUTROABS 15.9*  --   --   HGB 9.6* 9.4* 9.0*  HCT 29.0* 27.9* 27.7*  MCV 99.3 98.6 98.9  PLT 245 235 254   Cardiac Enzymes:  Recent Labs Lab 02/22/15 0557 02/22/15  1445 02/22/15 2359 02/23/15 1320  TROPONINI 0.08* <0.03 0.40* 1.62*   BNP (last 3 results)  Recent Labs  02/22/15 0557  BNP 228.8*     CBG:  Recent Labs Lab 02/23/15 0726 02/23/15 1124 02/23/15 1605 02/23/15 2026 02/24/15 0720  GLUCAP 113* 215* 199* 235* 142*    Recent Results (from the past 240 hour(s))  Blood culture (routine x 2)     Status: None (Preliminary result)   Collection Time: 02/22/15  8:14 AM  Result Value Ref Range Status   Specimen Description BLOOD LEFT ANTECUBITAL  Final   Special Requests BOTTLES DRAWN AEROBIC AND ANAEROBIC  10CC  Final   Culture NO GROWTH 2 DAYS  Final   Report Status PENDING  Incomplete  Blood culture (routine x 2)     Status: None (Preliminary result)   Collection Time: 02/22/15  8:22 AM  Result Value Ref Range Status   Specimen Description BLOOD LEFT HAND  Final   Special Requests BOTTLES DRAWN AEROBIC ONLY 10CC  Final   Culture NO GROWTH 2 DAYS  Final   Report Status PENDING  Incomplete     Studies: Nm Myocar Multi W/spect W/wall Motion / Ef  02/24/2015   CLINICAL DATA:  Chest pain. History of diabetes, hypertension and shortness of breath. Initial encounter.  EXAM: MYOCARDIAL IMAGING WITH SPECT (REST AND PHARMACOLOGIC-STRESS)  GATED LEFT VENTRICULAR WALL MOTION STUDY  LEFT VENTRICULAR EJECTION FRACTION  TECHNIQUE: Standard myocardial SPECT imaging was performed after resting intravenous injection of 10 mCi Tc-50m sestamibi. Subsequently, intravenous infusion of Lexiscan was performed under the supervision of the Cardiology staff. At peak effect of the drug, 30 mCi Tc-54m sestamibi was injected intravenously and standard myocardial SPECT imaging was performed. Quantitative gated imaging was also performed to evaluate left ventricular wall motion, and estimate left ventricular ejection fraction.  COMPARISON:  Radiographs 02/22/2015.  CT 11/06/2012.  FINDINGS: Perfusion: No decreased activity in the left ventricle on stress imaging to suggest reversible ischemia or infarction.  Wall Motion: Normal left ventricular wall motion. No left ventricular dilation.  Left Ventricular Ejection Fraction: 69 %  End diastolic volume 74 ml  End systolic volume 23 ml  IMPRESSION: 1. No reversible ischemia or infarction.  2. Normal left ventricular wall motion.  3. Left ventricular ejection fraction 69%  4. Low-risk stress test findings*.  *2012 Appropriate Use Criteria for Coronary Revascularization Focused Update: J Am Coll Cardiol. 4403;47(4):259-563. http://content.[email or web address].aspx?articleid=1201161    Electronically Signed   By: Richardean Sale M.D.   On: 02/24/2015 13:25    Scheduled Meds: . allopurinol  500 mg Oral BID  . amoxicillin-clavulanate  1 tablet Oral BID  . aspirin EC  81 mg Oral Daily  . atorvastatin  20 mg Oral QHS  . [START ON 02/25/2015] furosemide  80 mg Oral Daily  . heparin  5,000 Units Subcutaneous 3 times per day  . insulin aspart  0-9 Units Subcutaneous TID WC  . labetalol  200 mg Oral BID  . multivitamin with minerals  1 tablet Oral Daily  . omega-3 acid ethyl esters  1 g Oral Daily  . polyethylene glycol  17 g Oral Daily  . regadenoson      . regadenoson  0.4 mg Intravenous Once  . sodium chloride  3 mL Intravenous Q12H   Continuous Infusions:   Active Problems:   Rectum cancer   Hypertension   Diabetes mellitus   Renal insufficiency   High cholesterol   Leukocytosis   Lower extremity edema  Acute on chronic renal failure   Elevated troponin    Time spent: 25 min*    [facility]  [facility] Pager [phone]. If 7PM-7AM, please contact night-coverage at [email or web address], password [facility] 02/24/2015, 2:53 PM  LOS: 2 days

## 2015-02-24 NOTE — Progress Notes (Signed)
Echocardiogram 2D Echocardiogram has been performed.  Eugene [name] 02/24/2015, 1:08 PM

## 2015-02-24 NOTE — Progress Notes (Signed)
Assessment: 1. 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 Diuresing. Change to PO Furosemide 2. [facility] - improving; Anemia 3. Vol excess/ possible early CHF - improved 4. 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 DM 2 oral agents, 10 yr hx no complications 6. HTN on labetalol only now  Plan - cont to diurese  Subjective: Interval History: Breathing better, feels better  Objective: Vital signs in last 24 hours: Temp:  [97.5 F (36.4 [name])-98.9 F (37.2 [name])] 97.5 F (36.4 [name]) (08/15 1347) Pulse Rate:  [68-85] 77 (08/15 1347) Resp:  [17-18] 18 (08/15 1347) BP: (121-169)/(49-67) 121/56 mmHg (08/15 1347) SpO2:  [96 %-98 %] 98 % (08/15 1347) Weight:  [101.6 kg (223 lb 15.8 oz)] 101.6 kg (223 lb 15.8 oz) (08/14 2027) Weight change: -1.366 kg (-3 lb 0.2 oz)  Intake/Output from previous day: 08/14 0701 - 08/15 0700 In: 720 [P.O.:720] Out: 1750 [Urine:1750] Intake/Output this shift: Total I/O In: 0  Out: 1200 [Urine:1200]  General appearance: alert and cooperative Resp: clear to auscultation bilaterally Chest wall: no tenderness Cardio: regular rate and rhythm, S1, S2 normal, no murmur, click, rub or gallop Extremities: extremities normal, atraumatic, no cyanosis or edema  Lab Results:  Recent Labs  02/22/15 1445 02/23/15 0507  WBC 17.4* 15.5*  HGB 9.4* 9.0*  HCT 27.9* 27.7*  PLT 235 254   BMET:  Recent Labs  02/22/15 0557 02/22/15 1445 02/23/15 0507  NA 133*  --  135  K 4.4  --  4.0  CL 97*  --  98*  CO2 26  --  25  GLUCOSE 167*  --  128*  BUN 47*  --  44*  CREATININE 3.18* 3.08* 3.01*  CALCIUM 8.9  --  8.5*   No results for input(s): PTH in the last 72 hours. Iron Studies: No results for input(s): IRON, TIBC, TRANSFERRIN, FERRITIN in the last 72 hours. Studies/Results: Nm Myocar Multi W/spect W/wall Motion / Ef  02/24/2015   CLINICAL DATA:  Chest pain. History of diabetes, hypertension and shortness of breath. Initial encounter.  EXAM: MYOCARDIAL IMAGING  WITH SPECT (REST AND PHARMACOLOGIC-STRESS)  GATED LEFT VENTRICULAR WALL MOTION STUDY  LEFT VENTRICULAR EJECTION FRACTION  TECHNIQUE: Standard myocardial SPECT imaging was performed after resting intravenous injection of 10 mCi Tc-41m sestamibi. Subsequently, intravenous infusion of Lexiscan was performed under the supervision of the Cardiology staff. At peak effect of the drug, 30 mCi Tc-32m sestamibi was injected intravenously and standard myocardial SPECT imaging was performed. Quantitative gated imaging was also performed to evaluate left ventricular wall motion, and estimate left ventricular ejection fraction.  COMPARISON:  Radiographs 02/22/2015.  CT 11/06/2012.  FINDINGS: Perfusion: No decreased activity in the left ventricle on stress imaging to suggest reversible ischemia or infarction.  Wall Motion: Normal left ventricular wall motion. No left ventricular dilation.  Left Ventricular Ejection Fraction: 69 %  End diastolic volume 74 ml  End systolic volume 23 ml  IMPRESSION: 1. No reversible ischemia or infarction.  2. Normal left ventricular wall motion.  3. Left ventricular ejection fraction 69%  4. Low-risk stress test findings*.  *2012 Appropriate Use Criteria for Coronary Revascularization Focused Update: J Am Coll Cardiol. 4235;36(1):443-154. http://content.airportbarriers.com.aspx?articleid=1201161   Electronically Signed   By: Richardean Sale M.D.   On: 02/24/2015 13:25    Scheduled: . allopurinol  500 mg Oral BID  . amoxicillin-clavulanate  1 tablet Oral BID  . aspirin EC  81 mg Oral Daily  . atorvastatin  20 mg Oral QHS  .  furosemide  40 mg Intravenous 3 times per day  . heparin  5,000 Units Subcutaneous 3 times per day  . insulin aspart  0-9 Units Subcutaneous TID WC  . labetalol  200 mg Oral BID  . multivitamin with minerals  1 tablet Oral Daily  . omega-3 acid ethyl esters  1 g Oral Daily  . polyethylene glycol  17 g Oral Daily  . regadenoson      . regadenoson  0.4 mg  Intravenous Once  . sodium chloride  3 mL Intravenous Q12H    LOS: 2 days   Eugene [name] 02/24/2015,2:13 PM

## 2015-02-25 DIAGNOSIS — C20 Malignant neoplasm of rectum: Secondary | ICD-10-CM

## 2015-02-25 DIAGNOSIS — R079 Chest pain, unspecified: Secondary | ICD-10-CM | POA: Insufficient documentation

## 2015-02-25 DIAGNOSIS — D72829 Elevated white blood cell count, unspecified: Secondary | ICD-10-CM

## 2015-02-25 LAB — BASIC METABOLIC PANEL
Anion gap: 12 (ref 5–15)
BUN: 46 mg/dL — AB (ref 6–20)
CHLORIDE: 93 mmol/L — AB (ref 101–111)
CO2: 27 mmol/L (ref 22–32)
CREATININE: 3.31 mg/dL — AB (ref 0.61–1.24)
Calcium: 8.7 mg/dL — ABNORMAL LOW (ref 8.9–10.3)
GFR calc Af Amer: 20 mL/min — ABNORMAL LOW (ref >60)
GFR calc non Af Amer: 18 mL/min — ABNORMAL LOW (ref >60)
GLUCOSE: 263 mg/dL — AB (ref 65–99)
Potassium: 4.4 mmol/L (ref 3.5–5.1)
Sodium: 132 mmol/L — ABNORMAL LOW (ref 135–145)

## 2015-02-25 LAB — GLUCOSE, CAPILLARY: GLUCOSE-CAPILLARY: 276 mg/dL — AB (ref 65–99)

## 2015-02-25 LAB — CBC
HEMATOCRIT: 28.5 % — AB (ref 39.0–52.0)
HEMOGLOBIN: 9.4 g/dL — AB (ref 13.0–17.0)
MCH: 32.2 pg (ref 26.0–34.0)
MCHC: 33 g/dL (ref 30.0–36.0)
MCV: 97.6 fL (ref 78.0–100.0)
Platelets: 285 10*3/uL (ref 150–400)
RBC: 2.92 MIL/uL — ABNORMAL LOW (ref 4.22–5.81)
RDW: 13.9 % (ref 11.5–15.5)
WBC: 12.1 10*3/uL — ABNORMAL HIGH (ref 4.0–10.5)

## 2015-02-25 MED ORDER — ASPIRIN 81 MG PO TBEC: 81.0000 mg | DELAYED_RELEASE_TABLET | Freq: Every day | ORAL | Status: AC

## 2015-02-25 MED ORDER — FUROSEMIDE 80 MG PO TABS: 80.0000 mg | ORAL_TABLET | Freq: Every day | ORAL | Status: AC

## 2015-02-25 NOTE — Progress Notes (Signed)
Assessment: 1. 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 Diuresing well. Was on lasix IV 40 TID.  2. Leukocytosis - improved,  3. Chronic Anemia stable around 9.0.  4. Vol excess/ possible early CHF - improved. Likely 2/2 to dietary indiscretion+ extra volume. ECHO showed normal EF.  5. 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 DM 2 oral agents, 10 yr hx no complications 7. HTN on labetalol only now 8. Elevated troponin - s/p myoview showed no reversible infarct or ischemia. EF normal on echo and myoview, not good cath candidate due to renal insufficiency. Cards on board. Mild trop elevation likely from CKD?  Plan -  1. cont to diurese with lasix 80mg  PO daily 2. Needs outpatient nephro follow up.   Renal Attending: Improved clinically with diuresis. Agree with note as articulated above. Dewanna Hurston C   Subjective: Interval History: Breathing better, feels better. No CP.   Objective: Vital signs in last 24 hours: Temp:  [97.5 F (36.4 C)-98.8 F (37.1 C)] 98.8 F (37.1 C) (08/16 0440) Pulse Rate:  [69-85] 69 (08/16 0440) Resp:  [17-18] 18 (08/16 0440) BP: (121-169)/(56-67) 157/60 mmHg (08/16 0440) SpO2:  [96 %-99 %] 96 % (08/16 0440) Weight:  [222 lb 1.6 oz (100.744 kg)] 222 lb 1.6 oz (100.744 kg) (08/15 2100) Weight change: -1 lb 14.2 oz (-0.856 kg)  Intake/Output from previous day: 08/15 0701 - 08/16 0700 In: 450 [P.O.:450] Out: 1200 [Urine:1200] Intake/Output this shift:    General appearance: alert and cooperative Resp: clear to auscultation bilaterally Chest wall: no tenderness Cardio: regular rate and rhythm, S1, S2 normal, no murmur, click, rub or gallop Extremities: extremities normal, atraumatic, no cyanosis or edema  Lab Results:  Recent Labs  02/22/15 1445 02/23/15 0507  WBC 17.4* 15.5*  HGB 9.4* 9.0*  HCT 27.9* 27.7*  PLT 235 254   BMET:   Recent Labs  02/23/15 0507 02/25/15 0510  NA 135 132*  K 4.0 4.4  CL 98* 93*  CO2 25 27   GLUCOSE 128* 263*  BUN 44* 46*  CREATININE 3.01* 3.31*  CALCIUM 8.5* 8.7*   No results for input(s): PTH in the last 72 hours. Iron Studies: No results for input(s): IRON, TIBC, TRANSFERRIN, FERRITIN in the last 72 hours. Studies/Results: Nm Myocar Multi W/spect W/wall Motion / Ef  02/24/2015   CLINICAL DATA:  Chest pain. History of diabetes, hypertension and shortness of breath. Initial encounter.  EXAM: MYOCARDIAL IMAGING WITH SPECT (REST AND PHARMACOLOGIC-STRESS)  GATED LEFT VENTRICULAR WALL MOTION STUDY  LEFT VENTRICULAR EJECTION FRACTION  TECHNIQUE: Standard myocardial SPECT imaging was performed after resting intravenous injection of 10 mCi Tc-60m sestamibi. Subsequently, intravenous infusion of Lexiscan was performed under the supervision of the Cardiology staff. At peak effect of the drug, 30 mCi Tc-37m sestamibi was injected intravenously and standard myocardial SPECT imaging was performed. Quantitative gated imaging was also performed to evaluate left ventricular wall motion, and estimate left ventricular ejection fraction.  COMPARISON:  Radiographs 02/22/2015.  CT 11/06/2012.  FINDINGS: Perfusion: No decreased activity in the left ventricle on stress imaging to suggest reversible ischemia or infarction.  Wall Motion: Normal left ventricular wall motion. No left ventricular dilation.  Left Ventricular Ejection Fraction: 69 %  End diastolic volume 74 ml  End systolic volume 23 ml  IMPRESSION: 1. No reversible ischemia or infarction.  2. Normal left ventricular wall motion.  3. Left ventricular ejection fraction 69%  4. Low-risk stress test findings*.  *2012 Appropriate Use Criteria for Coronary Revascularization  Focused Update: J Am Coll Cardiol. 2542;70(6):237-628. http://content.[email or web address].aspx?articleid=1201161   Electronically Signed   By: Richardean Sale M.D.   On: 02/24/2015 13:25    Scheduled: . allopurinol  500 mg Oral BID  . amoxicillin-clavulanate  1 tablet Oral BID  .  aspirin EC  81 mg Oral Daily  . atorvastatin  20 mg Oral QHS  . furosemide  80 mg Oral Daily  . heparin  5,000 Units Subcutaneous 3 times per day  . insulin aspart  0-9 Units Subcutaneous TID WC  . labetalol  200 mg Oral BID  . multivitamin with minerals  1 tablet Oral Daily  . omega-3 acid ethyl esters  1 g Oral Daily  . polyethylene glycol  17 g Oral Daily  . regadenoson  0.4 mg Intravenous Once  . sodium chloride  3 mL Intravenous Q12H    LOS: 3 days   Ahmed, Tasrif 02/25/2015,8:18 AM

## 2015-02-25 NOTE — Progress Notes (Signed)
Utilization review completed. Iria Jamerson, RN, BSN. 

## 2015-02-25 NOTE — Progress Notes (Addendum)
Patient Name: Eugene [name] Date of Encounter: 02/25/2015  Active Problems:   Rectum cancer   Hypertension   Diabetes mellitus   Renal insufficiency   High cholesterol   Leukocytosis   Lower extremity edema   Acute on chronic renal failure   Elevated troponin  SUBJECTIVE  Feels better. No chest pain or SOB.   CURRENT MEDS . allopurinol  500 mg Oral BID  . aspirin EC  81 mg Oral Daily  . atorvastatin  20 mg Oral QHS  . furosemide  80 mg Oral Daily  . heparin  5,000 Units Subcutaneous 3 times per day  . insulin aspart  0-9 Units Subcutaneous TID WC  . labetalol  200 mg Oral BID  . multivitamin with minerals  1 tablet Oral Daily  . omega-3 acid ethyl esters  1 g Oral Daily  . polyethylene glycol  17 g Oral Daily  . regadenoson  0.4 mg Intravenous Once  . sodium chloride  3 mL Intravenous Q12H    OBJECTIVE  Filed Vitals:   02/24/15 1347 02/24/15 2100 02/25/15 0440 02/25/15 0850  BP: 121/56 130/64 157/60 159/59  Pulse: 77 69 69 66  Temp: 97.5 F (36.4 C) 98.6 F (37 C) 98.8 F (37.1 C) 98.6 F (37 C)  TempSrc: Oral  Oral Oral  Resp: 18 17 18 18   Height:      Weight:  222 lb 1.6 oz (100.744 kg)    SpO2: 98% 99% 96% 96%    Intake/Output Summary (Last 24 hours) at 02/25/15 1043 Last data filed at 02/25/15 0859  Gross per 24 hour  Intake    690 ml  Output      0 ml  Net    690 ml   Filed Weights   02/22/15 2043 02/23/15 2027 02/24/15 2100  Weight: 223 lb 14.4 oz (101.56 kg) 223 lb 15.8 oz (101.6 kg) 222 lb 1.6 oz (100.744 kg)    PHYSICAL EXAM  General: Pleasant, NAD. Neuro: Alert and oriented X 3. Moves all extremities spontaneously. Psych: Normal affect. HEENT:  Normal  Neck: Supple without bruits or JVD. Lungs:  Resp regular and unlabored, CTA. Heart: RRR no s3, s4, or murmurs. Abdomen: Soft, non-tender, non-distended, BS + x 4.  Extremities: No clubbing, cyanosis or edema. DP/PT/Radials 2+ and equal bilaterally.  Accessory Clinical  Findings  CBC  Recent Labs  02/22/15 1445 02/23/15 0507  WBC 17.4* 15.5*  HGB 9.4* 9.0*  HCT 27.9* 27.7*  MCV 98.6 98.9  PLT 235 253   Basic Metabolic Panel  Recent Labs  02/23/15 0507 02/25/15 0510  NA 135 132*  K 4.0 4.4  CL 98* 93*  CO2 25 27  GLUCOSE 128* 263*  BUN 44* 46*  CREATININE 3.01* 3.31*  CALCIUM 8.5* 8.7*   Liver Function Tests No results for input(s): AST, ALT, ALKPHOS, BILITOT, PROT, ALBUMIN in the last 72 hours. No results for input(s): LIPASE, AMYLASE in the last 72 hours. Cardiac Enzymes  Recent Labs  02/22/15 1445 02/22/15 2359 02/23/15 1320  TROPONINI <0.03 0.40* 1.62*   BNP Invalid input(s): POCBNP D-Dimer No results for input(s): DDIMER in the last 72 hours. Hemoglobin A1C  Recent Labs  02/22/15 1445  HGBA1C 6.5*    TELE  NSR  Radiology/Studies  Dg Chest 2 View  02/22/2015   CLINICAL DATA:  Chest tightness, shortness of breath for 1 week  EXAM: CHEST  2 VIEW  COMPARISON:  CT chest 11/06/2012  FINDINGS: There is no focal  parenchymal opacity. There is no pleural effusion or pneumothorax. The heart and mediastinal contours are unremarkable.  There is mild thoracic spine spondylosis.  IMPRESSION: No active cardiopulmonary disease.   Electronically Signed   By: Kathreen Devoid   On: 02/22/2015 07:30   US Renal  02/14/2015   CLINICAL DATA:  Chronic renal disease.  EXAM: RENAL / URINARY TRACT ULTRASOUND COMPLETE  COMPARISON:  12/26/2013.  FINDINGS: Right Kidney:  Length: 11.1 cm. Echogenicity within normal limits. No mass or hydronephrosis visualized.  Left Kidney:  Length: 11.4 cm. Echogenicity within normal limits. No mass or hydronephrosis visualized.  Bladder:  Appears normal for degree of bladder distention.  IMPRESSION: No acute or focal abnormality.   Electronically Signed   By: Marcello Moores  Register   On: 02/14/2015 10:49   Nm Myocar Multi W/spect W/wall Motion / Ef  02/24/2015   CLINICAL DATA:  Chest pain. History of diabetes,  hypertension and shortness of breath. Initial encounter.  EXAM: MYOCARDIAL IMAGING WITH SPECT (REST AND PHARMACOLOGIC-STRESS)  GATED LEFT VENTRICULAR WALL MOTION STUDY  LEFT VENTRICULAR EJECTION FRACTION  TECHNIQUE: Standard myocardial SPECT imaging was performed after resting intravenous injection of 10 mCi Tc-20m sestamibi. Subsequently, intravenous infusion of Lexiscan was performed under the supervision of the Cardiology staff. At peak effect of the drug, 30 mCi Tc-50m sestamibi was injected intravenously and standard myocardial SPECT imaging was performed. Quantitative gated imaging was also performed to evaluate left ventricular wall motion, and estimate left ventricular ejection fraction.  COMPARISON:  Radiographs 02/22/2015.  CT 11/06/2012.  FINDINGS: Perfusion: No decreased activity in the left ventricle on stress imaging to suggest reversible ischemia or infarction.  Wall Motion: Normal left ventricular wall motion. No left ventricular dilation.  Left Ventricular Ejection Fraction: 69 %  End diastolic volume 74 ml  End systolic volume 23 ml  IMPRESSION: 1. No reversible ischemia or infarction.  2. Normal left ventricular wall motion.  3. Left ventricular ejection fraction 69%  4. Low-risk stress test findings*.  *2012 Appropriate Use Criteria for Coronary Revascularization Focused Update: J Am Coll Cardiol. 4098;11(9):147-829. http://content.airportbarriers.com.aspx?articleid=1201161   Electronically Signed   By: Richardean Sale M.D.   On: 02/24/2015 13:25   Dg Abd 2 Views  02/22/2015   CLINICAL DATA:  Abdominal bloating.  EXAM: ABDOMEN - 2 VIEW  COMPARISON:  None.  FINDINGS: The bowel gas pattern is normal. There is no evidence of free air. No radio-opaque calculi or other significant radiographic abnormality is seen.  IMPRESSION: No evidence of bowel obstruction or ileus.   Electronically Signed   By: Marijo Conception, M.D.   On: 02/22/2015 09:38   Echo 02/24/15 LV EF:  60%  ------------------------------------------------------------------- Indications:   Edema 782.3.  ------------------------------------------------------------------- History:  Risk factors: Hypertension. Diabetes mellitus.  ------------------------------------------------------------------- Study Conclusions  - Left ventricle: The cavity size was normal. Wall thickness was increased in a pattern of mild LVH. The estimated ejection fraction was 60%. Wall motion was normal; there were no regional wall motion abnormalities. - Left atrium: The atrium was mildly dilated. - Right ventricle: The cavity size was normal. Systolic function was normal. - Right atrium: The atrium was mildly dilated.  ASSESSMENT AND PLAN   1. Elevated troponin - Peak of trop was 1.62. Myoview was low risk, no reversible ischemia with LV EF of 69%.  - Echo showed LV EF of 60%, mild LVH, mild dilated LA and RA.  - No chest pain. No further cardiac workup. Continue current regimen. No ACE inhibitor or ARB  in the setting of his renal failure.  2. Acute on chronic kidney disease - per nephrology. Creatinine worsen further. Avoid nephrotoxic agents.   3. Hypertension - Continue with labetalol. BP is midly elevated. Consider adding Amlodipine.   4. Hyperlipidemia - Continue with Lipitor  5. Lower extremity edema Lower extremity duplex is negative for DVT. Improved.   Signed, Bhagat,Bhavinkumar PA-C Pager [phone]    I have examined the patient and reviewed assessment and plan and discussed with patient.  Agree with above as stated.  No further cardiac testing at this time. Explained results to family.  OP sleep study recommended.  Reeda Soohoo S.

## 2015-02-25 NOTE — Progress Notes (Signed)
Inpatient Diabetes Program Recommendations  AACE/ADA: New Consensus Statement on Inpatient Glycemic Control (2013)  Target Ranges:  Prepandial:   less than 140 mg/dL      Peak postprandial:   less than 180 mg/dL (1-2 hours)      Critically ill patients:  140 - 180 mg/dL   Inpatient Diabetes Program Recommendations Insulin - Basal: add Levemir 10 units  Thank you  Davide Risdon BSN, RN,CDE Inpatient Diabetes Coordinator [phone] (team pager)     

## 2015-02-25 NOTE — Care Management Important Message (Signed)
Important Message  Patient Details  Name: BARNETT ELZEY MRN: [id number] Date of Birth: Jun 27, 1946   Medicare Important Message Given:  Yes-second notification given    Delorse Lek 02/25/2015, 2:57 PM

## 2015-02-25 NOTE — Discharge Summary (Signed)
Physician Discharge Summary  Eugene [name] IWP:[id number] DOB: [birth year] DOA: 02/22/2015  PCP: Gara Kroner, MD  Admit date: 02/22/2015 Discharge date: 02/25/2015  Time spent: *25  minutes  Recommendations for Outpatient Follow-up:  1. *Follow up PCP in 2 weeks  Discharge Diagnoses:  Active Problems:   Rectum cancer   Hypertension   Diabetes mellitus   Renal insufficiency   High cholesterol   Leukocytosis   Lower extremity edema   Acute on chronic renal failure   Elevated troponin   Chest pain   Discharge Condition: stable   Diet recommendation: Low salt diet  Filed Weights   02/22/15 2043 02/23/15 2027 02/24/15 2100  Weight: 101.56 kg (223 lb 14.4 oz) 101.6 kg (223 lb 15.8 oz) 100.744 kg (222 lb 1.6 oz)    History of present illness:  69 y.o. male, with past medical history of chronic kidney disease, rectal cancer in remission, type 2 diabetes mellitus, anemia, hypertension, patient is legally blind, patient presents with multiple complaints, reports he has not been feeling well for a week, reports generalized weakness, lower extremity edema, shortness of breath, and chest tightness, as well reports he had Scratched last week, started on Augmentin by his PCP, reportedly saw Dr. Justin Mend recently adjusted his antihypertensives medication, kept him on same dose Lasix.  Hospital Course:   Acute on chronic kidney disease Patient with worsening creatinine baseline 2.7, today is 3.31, nephrology consulted. patient was started on Lasix 40 mg IV Q 12 hr. Lasix now has been changed to 80 mg daily. Nephrology will follow the patient as outpatient,  Chest tightness with elevated troponin Patient came with chest tightness, cardiac enzymes now elevated last trop is 1.62 2 Echo is pending, EKG showed no acute finding. Started on aspirin, codeine beta blockers, Fish oil, no ACE inhibitor or ARB in the setting of his renal failure. Patient underwent Cardiolite stress test , which was  negative for reversible ischemia or infarction.Cardiology has signed off, and recommend to continue current regimen  Abdomen discomfort Abdomen xray is Negative for obstruction.  Leukocytosis Resolved, today wbc is 12.1 Patient has negative UA, CXR is normal. Patient was on Augmentin for cat scratch disease, has completed 10 days of therapy. Will discontinue Augmentin.  Diabetes mellitus Continue amaryl  Hypertension Continue with labetalol  Hyperlipidemia Continue with Lipitor  Lower extremity edema Lower extremity duplex is negative for DVT   Procedures:  Cardiac myoview  Consultations:  Crdiology  Nephrology  Discharge Exam: Filed Vitals:   02/25/15 0850  BP: 159/59  Pulse: 66  Temp: 98.6 F (37 C)  Resp: 18      Discharge Instructions   Discharge Instructions    Diet - low sodium heart healthy    Complete by:  As directed      Increase activity slowly    Complete by:  As directed           Current Discharge Medication List    START taking these medications   Details  aspirin EC 81 MG EC tablet Take 1 tablet (81 mg total) by mouth daily. Qty: 30 tablet, Refills: 2      CONTINUE these medications which have CHANGED   Details  furosemide (LASIX) 80 MG tablet Take 1 tablet (80 mg total) by mouth daily. Qty: 30 tablet, Refills: 2      CONTINUE these medications which have NOT CHANGED   Details  acetaminophen (TYLENOL) 325 MG tablet Take 650 mg by mouth every 6 (six) hours as needed  for mild pain.    allopurinol (ZYLOPRIM) 100 MG tablet Take 500 mg by mouth 2 (two) times daily. Pt takes one tablet in AM and one tablet in PM.    atorvastatin (LIPITOR) 20 MG tablet Take 20 mg by mouth at bedtime.   Associated Diagnoses: Rectum cancer; Hypertension; Diabetes mellitus; Renal insufficiency; High cholesterol    glimepiride (AMARYL) 2 MG tablet Take 2 mg by mouth daily before breakfast.   Associated Diagnoses: Rectum cancer; Hypertension;  Diabetes mellitus; Renal insufficiency; High cholesterol    labetalol (NORMODYNE) 200 MG tablet Take 200 mg by mouth 2 (two) times daily.    Multiple Vitamin (MULTIVITAMIN WITH MINERALS) TABS tablet Take 1 tablet by mouth daily.    Omega-3 Fatty Acids (FISH OIL) 1200 MG CAPS Take 1 capsule by mouth daily.   Associated Diagnoses: Rectum cancer; Hypertension; Diabetes mellitus; Renal insufficiency; High cholesterol      STOP taking these medications     amoxicillin-clavulanate (AUGMENTIN) 500-125 MG per tablet        No Known Allergies    The results of significant diagnostics from this hospitalization (including imaging, microbiology, ancillary and laboratory) are listed below for reference.    Significant Diagnostic Studies: Dg Chest 2 View  02/22/2015   CLINICAL DATA:  Chest tightness, shortness of breath for 1 week  EXAM: CHEST  2 VIEW  COMPARISON:  CT chest 11/06/2012  FINDINGS: There is no focal parenchymal opacity. There is no pleural effusion or pneumothorax. The heart and mediastinal contours are unremarkable.  There is mild thoracic spine spondylosis.  IMPRESSION: No active cardiopulmonary disease.   Electronically Signed   By: Kathreen Devoid   On: 02/22/2015 07:30   US Renal  02/14/2015   CLINICAL DATA:  Chronic renal disease.  EXAM: RENAL / URINARY TRACT ULTRASOUND COMPLETE  COMPARISON:  12/26/2013.  FINDINGS: Right Kidney:  Length: 11.1 cm. Echogenicity within normal limits. No mass or hydronephrosis visualized.  Left Kidney:  Length: 11.4 cm. Echogenicity within normal limits. No mass or hydronephrosis visualized.  Bladder:  Appears normal for degree of bladder distention.  IMPRESSION: No acute or focal abnormality.   Electronically Signed   By: Marcello Moores  Register   On: 02/14/2015 10:49   Nm Myocar Multi W/spect W/wall Motion / Ef  02/24/2015   CLINICAL DATA:  Chest pain. History of diabetes, hypertension and shortness of breath. Initial encounter.  EXAM: MYOCARDIAL IMAGING WITH  SPECT (REST AND PHARMACOLOGIC-STRESS)  GATED LEFT VENTRICULAR WALL MOTION STUDY  LEFT VENTRICULAR EJECTION FRACTION  TECHNIQUE: Standard myocardial SPECT imaging was performed after resting intravenous injection of 10 mCi Tc-28m sestamibi. Subsequently, intravenous infusion of Lexiscan was performed under the supervision of the Cardiology staff. At peak effect of the drug, 30 mCi Tc-63m sestamibi was injected intravenously and standard myocardial SPECT imaging was performed. Quantitative gated imaging was also performed to evaluate left ventricular wall motion, and estimate left ventricular ejection fraction.  COMPARISON:  Radiographs 02/22/2015.  CT 11/06/2012.  FINDINGS: Perfusion: No decreased activity in the left ventricle on stress imaging to suggest reversible ischemia or infarction.  Wall Motion: Normal left ventricular wall motion. No left ventricular dilation.  Left Ventricular Ejection Fraction: 69 %  End diastolic volume 74 ml  End systolic volume 23 ml  IMPRESSION: 1. No reversible ischemia or infarction.  2. Normal left ventricular wall motion.  3. Left ventricular ejection fraction 69%  4. Low-risk stress test findings*.  *2012 Appropriate Use Criteria for Coronary Revascularization Focused Update: J Am Coll  Cardiol. 3151;76(1):607-371. http://content.airportbarriers.com.aspx?articleid=1201161   Electronically Signed   By: Richardean Sale M.D.   On: 02/24/2015 13:25   Dg Abd 2 Views  02/22/2015   CLINICAL DATA:  Abdominal bloating.  EXAM: ABDOMEN - 2 VIEW  COMPARISON:  None.  FINDINGS: The bowel gas pattern is normal. There is no evidence of free air. No radio-opaque calculi or other significant radiographic abnormality is seen.  IMPRESSION: No evidence of bowel obstruction or ileus.   Electronically Signed   By: Marijo Conception, M.D.   On: 02/22/2015 09:38    Microbiology: Recent Results (from the past 240 hour(s))  Blood culture (routine x 2)     Status: None (Preliminary result)    Collection Time: 02/22/15  8:14 AM  Result Value Ref Range Status   Specimen Description BLOOD LEFT ANTECUBITAL  Final   Special Requests BOTTLES DRAWN AEROBIC AND ANAEROBIC 10CC  Final   Culture NO GROWTH 3 DAYS  Final   Report Status PENDING  Incomplete  Blood culture (routine x 2)     Status: None (Preliminary result)   Collection Time: 02/22/15  8:22 AM  Result Value Ref Range Status   Specimen Description BLOOD LEFT HAND  Final   Special Requests BOTTLES DRAWN AEROBIC ONLY 10CC  Final   Culture NO GROWTH 3 DAYS  Final   Report Status PENDING  Incomplete     Labs: Basic Metabolic Panel:  Recent Labs Lab 02/22/15 0557 02/22/15 1445 02/23/15 0507 02/25/15 0510  NA 133*  --  135 132*  K 4.4  --  4.0 4.4  CL 97*  --  98* 93*  CO2 26  --  25 27  GLUCOSE 167*  --  128* 263*  BUN 47*  --  44* 46*  CREATININE 3.18* 3.08* 3.01* 3.31*  CALCIUM 8.9  --  8.5* 8.7*   Liver Function Tests:  Recent Labs Lab 02/22/15 0557  AST 29  ALT 26  ALKPHOS 110  BILITOT 0.4  PROT 6.5  ALBUMIN 2.6*   No results for input(s): LIPASE, AMYLASE in the last 168 hours. No results for input(s): AMMONIA in the last 168 hours. CBC:  Recent Labs Lab 02/22/15 0557 02/22/15 1445 02/23/15 0507 02/25/15 1124  WBC 19.0* 17.4* 15.5* 12.1*  NEUTROABS 15.9*  --   --   --   HGB 9.6* 9.4* 9.0* 9.4*  HCT 29.0* 27.9* 27.7* 28.5*  MCV 99.3 98.6 98.9 97.6  PLT 245 235 254 285   Cardiac Enzymes:  Recent Labs Lab 02/22/15 0557 02/22/15 1445 02/22/15 2359 02/23/15 1320  TROPONINI 0.08* <0.03 0.40* 1.62*   BNP: BNP (last 3 results)  Recent Labs  02/22/15 0557  BNP 228.8*    ProBNP (last 3 results) No results for input(s): PROBNP in the last 8760 hours.  CBG:  Recent Labs Lab 02/23/15 2026 02/24/15 0720 02/24/15 1612 02/24/15 2141 02/25/15 0719  GLUCAP 235* 142* 229* 223* 276*       Signed:  Vista Sawatzky S  [facility] 02/25/2015, 4:55 PM

## 2015-02-25 NOTE — Progress Notes (Addendum)
[facility] PROGRESS NOTE  HENSON FRATICELLI GHW:[id number] DOB: 08/15/1945 DOA: 02/22/2015 PCP: Gara Kroner, MD  Assessment/Plan:  Acute on chronic kidney disease Patient with worsening creatinine baseline 2.7, today is 3.31, nephrology consulted. patient was started on Lasix 40 mg IV Q 12 hr. Lasix now has been changed to 80 mg daily. Will check BMP in a.m. Nephrology is following.  Chest tightness with elevated troponin Patient came with chest tightness, cardiac enzymes now elevated last trop is 1.62 2  Echo is pending, EKG showed no acute finding. Started on aspirin, codeine beta blockers, Fish oil, no ACE inhibitor or ARB in the setting of his renal failure. Patient underwent Cardiolite stress test , which was negative for reversible ischemia or infarction.  Abdomen discomfort Abdomen xray is  Negative for obstruction.  Leukocytosis Resolved, today wbc is 12.1 Patient has negative UA, CXR is normal. Patient  was on Augmentin for cat scratch disease, has completed 10 days of therapy. Will discontinue Augmentin.  Diabetes mellitus  Hold Amaryl, continue with insulin sliding scale  Hypertension  Continue with labetalol  Hyperlipidemia  Continue with Lipitor  Lower extremity edema Lower extremity duplex is negative for DVT  DVT Prophylaxis  Heparin   Code Status: DNR Family Communication: Discussed with wife at bedside Disposition Plan:  Home when medically stable   Consultants:  Cardiology   Procedures:  None  Antibiotics:  None  HPI/Subjective: 69 y.o. male, with past medical history of chronic kidney disease, rectal cancer in remission, type 2 diabetes mellitus, anemia, hypertension, patient is legally blind, patient presents with multiple complaints, reports he has not been feeling well for a week, reports generalized weakness, lower extremity edema, shortness of breath, and chest tightness, as well reports he had Scratched last week, started on  Augmentin by his PCP, reportedly saw Dr. Justin Mend recently adjusted his antihypertensives medication, kept him on same dose Lasix.  Patient had cardiac myoview  stress test yesterday, which was negative for any significant abnormality.  Objective: Filed Vitals:   02/25/15 0850  BP: 159/59  Pulse: 66  Temp: 98.6 F (37 C)  Resp: 18    Intake/Output Summary (Last 24 hours) at 02/25/15 1433 Last data filed at 02/25/15 0859  Gross per 24 hour  Intake    690 ml  Output      0 ml  Net    690 ml   Filed Weights   02/22/15 2043 02/23/15 2027 02/24/15 2100  Weight: 101.56 kg (223 lb 14.4 oz) 101.6 kg (223 lb 15.8 oz) 100.744 kg (222 lb 1.6 oz)    Exam:   General:  Appears in no acute distress  Cardiovascular: S1S2 RRR  Respiratory: Clear bilaterally  Abdomen: Soft, non tender, no organomegaly  Musculoskeletal: *No edema of the lower extremities  Data Reviewed: Basic Metabolic Panel:  Recent Labs Lab 02/22/15 0557 02/22/15 1445 02/23/15 0507 02/25/15 0510  NA 133*  --  135 132*  K 4.4  --  4.0 4.4  CL 97*  --  98* 93*  CO2 26  --  25 27  GLUCOSE 167*  --  128* 263*  BUN 47*  --  44* 46*  CREATININE 3.18* 3.08* 3.01* 3.31*  CALCIUM 8.9  --  8.5* 8.7*   Liver Function Tests:  Recent Labs Lab 02/22/15 0557  AST 29  ALT 26  ALKPHOS 110  BILITOT 0.4  PROT 6.5  ALBUMIN 2.6*   CBC:  Recent Labs Lab 02/22/15 0557 02/22/15 1445 02/23/15 0507 02/25/15  1124  WBC 19.0* 17.4* 15.5* 12.1*  NEUTROABS 15.9*  --   --   --   HGB 9.6* 9.4* 9.0* 9.4*  HCT 29.0* 27.9* 27.7* 28.5*  MCV 99.3 98.6 98.9 97.6  PLT 245 235 254 285   Cardiac Enzymes:  Recent Labs Lab 02/22/15 0557 02/22/15 1445 02/22/15 2359 02/23/15 1320  TROPONINI 0.08* <0.03 0.40* 1.62*   BNP (last 3 results)  Recent Labs  02/22/15 0557  BNP 228.8*     CBG:  Recent Labs Lab 02/23/15 2026 02/24/15 0720 02/24/15 1612 02/24/15 2141 02/25/15 0719  GLUCAP 235* 142* 229* 223* 276*     Recent Results (from the past 240 hour(s))  Blood culture (routine x 2)     Status: None (Preliminary result)   Collection Time: 02/22/15  8:14 AM  Result Value Ref Range Status   Specimen Description BLOOD LEFT ANTECUBITAL  Final   Special Requests BOTTLES DRAWN AEROBIC AND ANAEROBIC 10CC  Final   Culture NO GROWTH 3 DAYS  Final   Report Status PENDING  Incomplete  Blood culture (routine x 2)     Status: None (Preliminary result)   Collection Time: 02/22/15  8:22 AM  Result Value Ref Range Status   Specimen Description BLOOD LEFT HAND  Final   Special Requests BOTTLES DRAWN AEROBIC ONLY 10CC  Final   Culture NO GROWTH 3 DAYS  Final   Report Status PENDING  Incomplete     Studies: Nm Myocar Multi W/spect W/wall Motion / Ef  02/24/2015   CLINICAL DATA:  Chest pain. History of diabetes, hypertension and shortness of breath. Initial encounter.  EXAM: MYOCARDIAL IMAGING WITH SPECT (REST AND PHARMACOLOGIC-STRESS)  GATED LEFT VENTRICULAR WALL MOTION STUDY  LEFT VENTRICULAR EJECTION FRACTION  TECHNIQUE: Standard myocardial SPECT imaging was performed after resting intravenous injection of 10 mCi Tc-31m sestamibi. Subsequently, intravenous infusion of Lexiscan was performed under the supervision of the Cardiology staff. At peak effect of the drug, 30 mCi Tc-73m sestamibi was injected intravenously and standard myocardial SPECT imaging was performed. Quantitative gated imaging was also performed to evaluate left ventricular wall motion, and estimate left ventricular ejection fraction.  COMPARISON:  Radiographs 02/22/2015.  CT 11/06/2012.  FINDINGS: Perfusion: No decreased activity in the left ventricle on stress imaging to suggest reversible ischemia or infarction.  Wall Motion: Normal left ventricular wall motion. No left ventricular dilation.  Left Ventricular Ejection Fraction: 69 %  End diastolic volume 74 ml  End systolic volume 23 ml  IMPRESSION: 1. No reversible ischemia or infarction.  2.  Normal left ventricular wall motion.  3. Left ventricular ejection fraction 69%  4. Low-risk stress test findings*.  *2012 Appropriate Use Criteria for Coronary Revascularization Focused Update: J Am Coll Cardiol. 3235;57(3):220-254. http://content.airportbarriers.com.aspx?articleid=1201161   Electronically Signed   By: Richardean Sale M.D.   On: 02/24/2015 13:25    Scheduled Meds: . allopurinol  500 mg Oral BID  . aspirin EC  81 mg Oral Daily  . atorvastatin  20 mg Oral QHS  . furosemide  80 mg Oral Daily  . heparin  5,000 Units Subcutaneous 3 times per day  . insulin aspart  0-9 Units Subcutaneous TID WC  . labetalol  200 mg Oral BID  . multivitamin with minerals  1 tablet Oral Daily  . omega-3 acid ethyl esters  1 g Oral Daily  . polyethylene glycol  17 g Oral Daily  . regadenoson  0.4 mg Intravenous Once  . sodium chloride  3 mL Intravenous Q12H  Continuous Infusions:   Active Problems:   Rectum cancer   Hypertension   Diabetes mellitus   Renal insufficiency   High cholesterol   Leukocytosis   Lower extremity edema   Acute on chronic renal failure   Elevated troponin    Time spent: 25 min*    [facility]  [facility] Pager [phone]. If 7PM-7AM, please contact night-coverage at [email or web address], password [facility] 02/25/2015, 2:33 PM  LOS: 3 days

## 2015-02-27 LAB — CULTURE, BLOOD (ROUTINE X 2)
CULTURE: NO GROWTH
CULTURE: NO GROWTH

## 2015-03-21 ENCOUNTER — Other Ambulatory Visit: Payer: Self-pay | Admitting: *Deleted

## 2015-03-21 DIAGNOSIS — Z01810 Encounter for preprocedural cardiovascular examination: Secondary | ICD-10-CM

## 2015-03-21 DIAGNOSIS — N184 Chronic kidney disease, stage 4 (severe): Secondary | ICD-10-CM

## 2015-04-11 ENCOUNTER — Encounter: Payer: Self-pay | Admitting: Surgery

## 2015-04-14 ENCOUNTER — Encounter: Payer: Self-pay | Admitting: Surgery

## 2015-04-14 ENCOUNTER — Ambulatory Visit (INDEPENDENT_AMBULATORY_CARE_PROVIDER_SITE_OTHER)
Admission: RE | Admit: 2015-04-14 | Discharge: 2015-04-14 | Disposition: A | Discharge status: Home / Self Care | Payer: Medicare Other | Source: Ambulatory Visit | Attending: Surgery | Admitting: Surgery

## 2015-04-14 ENCOUNTER — Ambulatory Visit (INDEPENDENT_AMBULATORY_CARE_PROVIDER_SITE_OTHER): Payer: Medicare Other | Admitting: Surgery

## 2015-04-14 ENCOUNTER — Other Ambulatory Visit: Payer: Self-pay

## 2015-04-14 ENCOUNTER — Ambulatory Visit (HOSPITAL_COMMUNITY)
Admission: RE | Admit: 2015-04-14 | Discharge: 2015-04-14 | Disposition: A | Discharge status: Home / Self Care | Payer: Medicare Other | Source: Ambulatory Visit | Attending: Surgery | Admitting: Surgery

## 2015-04-14 VITALS — BP 180/67 | HR 72 | Ht 67.0 in | Wt 265.5 lb

## 2015-04-14 DIAGNOSIS — N184 Chronic kidney disease, stage 4 (severe): Secondary | ICD-10-CM

## 2015-04-14 DIAGNOSIS — Z01810 Encounter for preprocedural cardiovascular examination: Secondary | ICD-10-CM | POA: Diagnosis not present

## 2015-04-14 NOTE — Progress Notes (Signed)
Patient name: Eugene [name] MRN: [id number] DOB: [birth year] Sex: male   Referred by: Dr. Justin Mend  Reason for referral:  Chief Complaint  Patient presents with  . Re-evaluation    eval for new access     HISTORY OF PRESENT ILLNESS: This is a 69 year old gentleman who is referred by Dr. Justin Mend for evaluation for dialysis access.  The patient is right handed.  He is not yet on dialysis.  He states his renal failure is secondary to contrast administration as well as hypertension.  The patient has a history of rectal cancer, for which she underwent surgery and radiation.  He also suffers from hypertension which is medically managed.  His hypercholesterolemia is managed with a statin.  He is a former smoker.  The patient suffers from diabetes.  His last hemoglobin A1c was 6.5.  Past Medical History  Diagnosis Date  . Hypertension   . Type 2 diabetes mellitus ([place])   . Anemia   . Renal disorder   . Cancer [facility])     rectal ca    Past Surgical History  Procedure Laterality Date  . Elbow surgery Right 2008    right-tendon and lipoma  . Tonsillectomy  1992  . Rectal surgery N/A 2009    rectal surgery for cancer  . Eye surgery Bilateral 1986    lens implant    Social History   Social History  . Marital Status: Married    Spouse Name: N/A  . Number of Children: N/A  . Years of Education: N/A   Occupational History  . Not on file.   Social History Main Topics  . Smoking status: Former Smoker -- 1.00 packs/day for 10 years    Types: Cigarettes    Quit date: 09/28/1975  . Smokeless tobacco: Not on file  . Alcohol Use: 8.4 oz/week    14 Shots of liquor per week     Comment: a couple drinks a night-bourbon (2 oz)  . Drug Use: No  . Sexual Activity: Yes    Birth Control/ Protection: None   Other Topics Concern  . Not on file   Social History Narrative    Family History  Problem Relation Age of Onset  . Heart attack Mother   . Heart disease Father     before age  26  . Hyperlipidemia Father   . Heart attack Father   . Peripheral vascular disease Father     Allergies as of 04/14/2015  . (No Known Allergies)    Current Outpatient Prescriptions on File Prior to Visit  Medication Sig Dispense Refill  . acetaminophen (TYLENOL) 325 MG tablet Take 650 mg by mouth every 6 (six) hours as needed for mild pain.    Marland Kitchen allopurinol (ZYLOPRIM) 100 MG tablet Take 500 mg by mouth 2 (two) times daily. Pt takes one tablet in AM and one tablet in PM.    . aspirin EC 81 MG EC tablet Take 1 tablet (81 mg total) by mouth daily. 30 tablet 2  . atorvastatin (LIPITOR) 20 MG tablet Take 20 mg by mouth at bedtime.    . furosemide (LASIX) 80 MG tablet Take 1 tablet (80 mg total) by mouth daily. 30 tablet 2  . glimepiride (AMARYL) 2 MG tablet Take 2 mg by mouth daily before breakfast.    . labetalol (NORMODYNE) 200 MG tablet Take 200 mg by mouth 2 (two) times daily.    . Multiple Vitamin (MULTIVITAMIN WITH MINERALS) TABS tablet Take 1  tablet by mouth daily.    . Omega-3 Fatty Acids (FISH OIL) 1200 MG CAPS Take 1 capsule by mouth daily.     No current facility-administered medications on file prior to visit.     REVIEW OF SYSTEMS: Cardiovascular: No chest pain, chest pressure, palpitations, orthopnea, or dyspnea on exertion. No claudication or rest pain,  No history of DVT or phlebitis.  Positive for leg swelling Pulmonary: No productive cough, asthma or wheezing. Neurologic: No weakness, paresthesias, aphasia, or amaurosis. No dizziness. Hematologic: No bleeding problems or clotting disorders. Musculoskeletal: No joint pain or joint swelling. Gastrointestinal: No blood in stool or hematemesis Genitourinary: No dysuria or hematuria. Psychiatric:: No history of major depression. Integumentary: No rashes or ulcers. Constitutional: No fever or chills.  PHYSICAL EXAMINATION:  Filed Vitals:   04/14/15 0841 04/14/15 0844  BP: 152/58 180/67  Pulse: 72   Height: 5\' 7"   (1.702 m)   Weight: 265 lb 8 oz (120.43 kg)   SpO2: 98%    Body mass index is 41.57 kg/(m^2). General: The patient appears their stated age.   HEENT:  No gross abnormalities Pulmonary: Respirations are non-labored Abdomen: Soft and non-tender  Musculoskeletal: There are no major deformities.   Neurologic: No focal weakness or paresthesias are detected, Skin: There are no ulcer or rashes noted. Psychiatric: The patient has normal affect. Cardiovascular: Palpable radial pulse.  Pitting edema, bilateral lower extremity  Diagnostic Studies: I have reviewed his vascular lab studies.  He has triphasic arterial waveforms.  The cephalic vein of the left is excellent beginning the antecubital crease.  It gets down to 0.22 mm at the wrist.  Assessment:  Chronic renal insufficiency, stage IV Plan: I discussed proceeding with a left arm fistula.  I would evaluate his cephalic vein at the wrist and determine whether or not a radiocephalic fistula is the appropriate operation for if he should have a brachiocephalic fistula.  I discussed the risks and benefits of the procedure with the patient and his wife.  We discussed the possibility of non-maturation and the need for future interventions.  All of his questions were answered.  His operations been scheduled for Friday, October 14  V. Leia Alf, M.D. [facility] [place] Office: [phone] Pager:  [phone]

## 2015-04-24 ENCOUNTER — Encounter (HOSPITAL_COMMUNITY): Payer: Self-pay | Admitting: *Deleted

## 2015-04-24 MED ORDER — SODIUM CHLORIDE 0.9 % IV SOLN
INTRAVENOUS | Status: DC
  Administered 2015-04-25: 08:00:00 via INTRAVENOUS

## 2015-04-24 MED ORDER — DEXTROSE 5 % IV SOLN
1.5000 g | INTRAVENOUS | Status: AC
  Administered 2015-04-25: 1.5 g via INTRAVENOUS
  Filled 2015-04-24: qty 1.5

## 2015-04-24 MED ORDER — CHLORHEXIDINE GLUCONATE CLOTH 2 % EX PADS: 6.0000 | MEDICATED_PAD | Freq: Once | CUTANEOUS | Status: DC

## 2015-04-24 NOTE — Progress Notes (Signed)
Spoke with pt's wife for pre-op call. Pt was recently worked up for chest pain and sob with an Echo and Stress test. She states his heart checked out fine, the pain was due to fluid overload from renal disease. States pt is feeling much better now that the fluid is gone.

## 2015-04-24 NOTE — Progress Notes (Signed)
   04/24/15 1922  OBSTRUCTIVE SLEEP APNEA  Have you ever been diagnosed with sleep apnea through a sleep study? No  Do you snore loudly (loud enough to be heard through closed doors)?  1  Do you often feel tired, fatigued, or sleepy during the daytime (such as falling asleep during driving or talking to someone)? 1  Has anyone observed you stop breathing during your sleep? 1  Do you have, or are you being treated for high blood pressure? 1  BMI more than 35 kg/m2? 1  Age > 50 (1-yes) 1  Neck circumference greater than:Male 16 inches or larger, Male 17inches or larger? 1  Male Gender (Yes=1) 1  Obstructive Sleep Apnea Score 8  Score 5 or greater  Results sent to PCP

## 2015-04-25 ENCOUNTER — Encounter (HOSPITAL_COMMUNITY): Payer: Self-pay | Admitting: *Deleted

## 2015-04-25 ENCOUNTER — Ambulatory Visit (HOSPITAL_COMMUNITY): Payer: Medicare Other | Admitting: Certified Registered Nurse Anesthetist

## 2015-04-25 ENCOUNTER — Other Ambulatory Visit: Payer: Medicare Other | Admitting: *Deleted

## 2015-04-25 ENCOUNTER — Encounter (HOSPITAL_COMMUNITY)
Admission: RE | Disposition: A | Discharge status: Home / Self Care | Payer: Self-pay | Source: Ambulatory Visit | Attending: Surgery

## 2015-04-25 ENCOUNTER — Ambulatory Visit (HOSPITAL_COMMUNITY)
Admission: RE | Admit: 2015-04-25 | Discharge: 2015-04-25 | Disposition: A | Discharge status: Home / Self Care | Payer: Medicare Other | Source: Ambulatory Visit | Attending: Surgery | Admitting: Surgery

## 2015-04-25 DIAGNOSIS — I129 Hypertensive chronic kidney disease with stage 1 through stage 4 chronic kidney disease, or unspecified chronic kidney disease: Secondary | ICD-10-CM | POA: Insufficient documentation

## 2015-04-25 DIAGNOSIS — Z7984 Long term (current) use of oral hypoglycemic drugs: Secondary | ICD-10-CM | POA: Diagnosis not present

## 2015-04-25 DIAGNOSIS — Z79899 Other long term (current) drug therapy: Secondary | ICD-10-CM | POA: Diagnosis not present

## 2015-04-25 DIAGNOSIS — Z4931 Encounter for adequacy testing for hemodialysis: Secondary | ICD-10-CM

## 2015-04-25 DIAGNOSIS — N184 Chronic kidney disease, stage 4 (severe): Secondary | ICD-10-CM | POA: Diagnosis not present

## 2015-04-25 DIAGNOSIS — E1122 Type 2 diabetes mellitus with diabetic chronic kidney disease: Secondary | ICD-10-CM | POA: Insufficient documentation

## 2015-04-25 DIAGNOSIS — Z7982 Long term (current) use of aspirin: Secondary | ICD-10-CM | POA: Diagnosis not present

## 2015-04-25 DIAGNOSIS — Z85048 Personal history of other malignant neoplasm of rectum, rectosigmoid junction, and anus: Secondary | ICD-10-CM | POA: Diagnosis not present

## 2015-04-25 DIAGNOSIS — Z87891 Personal history of nicotine dependence: Secondary | ICD-10-CM | POA: Insufficient documentation

## 2015-04-25 DIAGNOSIS — E7800 Pure hypercholesterolemia, unspecified: Secondary | ICD-10-CM | POA: Diagnosis not present

## 2015-04-25 DIAGNOSIS — D649 Anemia, unspecified: Secondary | ICD-10-CM | POA: Insufficient documentation

## 2015-04-25 DIAGNOSIS — Z6841 Body Mass Index (BMI) 40.0 and over, adult: Secondary | ICD-10-CM | POA: Insufficient documentation

## 2015-04-25 DIAGNOSIS — N186 End stage renal disease: Secondary | ICD-10-CM

## 2015-04-25 HISTORY — DX: Pigmentary retinal dystrophy: H35.52

## 2015-04-25 HISTORY — DX: Diarrhea, unspecified: R19.7

## 2015-04-25 HISTORY — DX: Unqualified visual loss, both eyes: H54.3

## 2015-04-25 HISTORY — PX: AV FISTULA PLACEMENT: SHX1204

## 2015-04-25 LAB — GLUCOSE, CAPILLARY
GLUCOSE-CAPILLARY: 70 mg/dL (ref 65–99)
Glucose-Capillary: 116 mg/dL — ABNORMAL HIGH (ref 65–99)
Glucose-Capillary: 47 mg/dL — ABNORMAL LOW (ref 65–99)
Glucose-Capillary: 54 mg/dL — ABNORMAL LOW (ref 65–99)
Glucose-Capillary: 73 mg/dL (ref 65–99)
Glucose-Capillary: 82 mg/dL (ref 65–99)
Glucose-Capillary: 97 mg/dL (ref 65–99)

## 2015-04-25 LAB — POCT I-STAT 4, (NA,K, GLUC, HGB,HCT)
GLUCOSE: 54 mg/dL — AB (ref 65–99)
HEMATOCRIT: 32 % — AB (ref 39.0–52.0)
HEMOGLOBIN: 10.9 g/dL — AB (ref 13.0–17.0)
POTASSIUM: 4.2 mmol/L (ref 3.5–5.1)
SODIUM: 139 mmol/L (ref 135–145)

## 2015-04-25 SURGERY — ARTERIOVENOUS (AV) FISTULA CREATION
Anesthesia: Monitor Anesthesia Care | Site: Arm Upper | Laterality: Left

## 2015-04-25 MED ORDER — SODIUM CHLORIDE 0.9 % IV SOLN
INTRAVENOUS | Status: DC | PRN
  Administered 2015-04-25 (×2): via INTRAVENOUS

## 2015-04-25 MED ORDER — 0.9 % SODIUM CHLORIDE (POUR BTL) OPTIME
TOPICAL | Status: DC | PRN
  Administered 2015-04-25: 1000 mL

## 2015-04-25 MED ORDER — SODIUM CHLORIDE 0.9 % IV SOLN
INTRAVENOUS | Status: DC | PRN
  Administered 2015-04-25: 500 mL

## 2015-04-25 MED ORDER — PROTAMINE SULFATE 10 MG/ML IV SOLN
INTRAVENOUS | Status: DC | PRN
  Administered 2015-04-25: 10 mg via INTRAVENOUS
  Administered 2015-04-25: 5 mg via INTRAVENOUS
  Administered 2015-04-25: 10 mg via INTRAVENOUS

## 2015-04-25 MED ORDER — PROPOFOL 500 MG/50ML IV EMUL
INTRAVENOUS | Status: DC | PRN
  Administered 2015-04-25: 100 ug/kg/min via INTRAVENOUS

## 2015-04-25 MED ORDER — SUCCINYLCHOLINE CHLORIDE 20 MG/ML IJ SOLN
INTRAMUSCULAR | Status: AC
  Filled 2015-04-25: qty 1

## 2015-04-25 MED ORDER — MIDAZOLAM HCL 2 MG/2ML IJ SOLN
INTRAMUSCULAR | Status: AC
  Filled 2015-04-25: qty 4

## 2015-04-25 MED ORDER — FENTANYL CITRATE (PF) 250 MCG/5ML IJ SOLN
INTRAMUSCULAR | Status: AC
  Filled 2015-04-25: qty 5

## 2015-04-25 MED ORDER — DEXTROSE 50 % IV SOLN
25.0000 mL | Freq: Once | INTRAVENOUS | Status: AC
  Administered 2015-04-25: 25 mL via INTRAVENOUS

## 2015-04-25 MED ORDER — LIDOCAINE-EPINEPHRINE (PF) 1 %-1:200000 IJ SOLN
INTRAMUSCULAR | Status: DC | PRN
  Administered 2015-04-25: 30 mL via INTRADERMAL

## 2015-04-25 MED ORDER — DEXTROSE 50 % IV SOLN
INTRAVENOUS | Status: AC
  Filled 2015-04-25: qty 50

## 2015-04-25 MED ORDER — OXYCODONE-ACETAMINOPHEN 5-325 MG PO TABS: 1.0000 | ORAL_TABLET | Freq: Four times a day (QID) | ORAL | Status: DC | PRN

## 2015-04-25 MED ORDER — LIDOCAINE-EPINEPHRINE (PF) 1 %-1:200000 IJ SOLN
INTRAMUSCULAR | Status: AC
  Filled 2015-04-25: qty 30

## 2015-04-25 MED ORDER — SODIUM CHLORIDE 0.9 % IV SOLN: INTRAVENOUS | Status: DC

## 2015-04-25 MED ORDER — MIDAZOLAM HCL 5 MG/5ML IJ SOLN
INTRAMUSCULAR | Status: DC | PRN
  Administered 2015-04-25: 2 mg via INTRAVENOUS

## 2015-04-25 MED ORDER — HEPARIN SODIUM (PORCINE) 1000 UNIT/ML IJ SOLN
INTRAMUSCULAR | Status: DC | PRN
  Administered 2015-04-25: 3 mL via INTRAVENOUS

## 2015-04-25 SURGICAL SUPPLY — 29 items
ARMBAND PINK RESTRICT EXTREMIT (MISCELLANEOUS) ×3 IMPLANT
CANISTER SUCTION 2500CC (MISCELLANEOUS) ×3 IMPLANT
CLIP TI MEDIUM 6 (CLIP) ×3 IMPLANT
CLIP TI WIDE RED SMALL 6 (CLIP) ×3 IMPLANT
COVER PROBE W GEL 5X96 (DRAPES) ×3 IMPLANT
ELECT REM PT RETURN 9FT ADLT (ELECTROSURGICAL) ×3
ELECTRODE REM PT RTRN 9FT ADLT (ELECTROSURGICAL) ×1 IMPLANT
GLOVE BIOGEL PI IND STRL 6.5 (GLOVE) ×1 IMPLANT
GLOVE BIOGEL PI IND STRL 7.5 (GLOVE) ×1 IMPLANT
GLOVE BIOGEL PI INDICATOR 6.5 (GLOVE) ×2
GLOVE BIOGEL PI INDICATOR 7.5 (GLOVE) ×2
GLOVE SURG SS PI 7.5 STRL IVOR (GLOVE) ×3 IMPLANT
GOWN STRL REUS W/ TWL LRG LVL3 (GOWN DISPOSABLE) ×2 IMPLANT
GOWN STRL REUS W/ TWL XL LVL3 (GOWN DISPOSABLE) ×1 IMPLANT
GOWN STRL REUS W/TWL LRG LVL3 (GOWN DISPOSABLE) ×6
GOWN STRL REUS W/TWL XL LVL3 (GOWN DISPOSABLE) ×3
HEMOSTAT SNOW SURGICEL 2X4 (HEMOSTASIS) IMPLANT
KIT BASIN OR (CUSTOM PROCEDURE TRAY) ×3 IMPLANT
KIT ROOM TURNOVER OR (KITS) ×3 IMPLANT
LIQUID BAND (GAUZE/BANDAGES/DRESSINGS) ×3 IMPLANT
NS IRRIG 1000ML POUR BTL (IV SOLUTION) ×3 IMPLANT
PACK CV ACCESS (CUSTOM PROCEDURE TRAY) ×3 IMPLANT
PAD ARMBOARD 7.5X6 YLW CONV (MISCELLANEOUS) ×6 IMPLANT
SUT PROLENE 6 0 CC (SUTURE) ×3 IMPLANT
SUT VIC AB 3-0 SH 27 (SUTURE) ×3
SUT VIC AB 3-0 SH 27X BRD (SUTURE) ×1 IMPLANT
SUT VICRYL 4-0 PS2 18IN ABS (SUTURE) IMPLANT
UNDERPAD 30X30 INCONTINENT (UNDERPADS AND DIAPERS) ×3 IMPLANT
WATER STERILE IRR 1000ML POUR (IV SOLUTION) ×3 IMPLANT

## 2015-04-25 NOTE — Interval H&P Note (Signed)
History and Physical Interval Note:  04/25/2015 9:19 AM  Eugene [name]  has presented today for surgery, with the diagnosis of Stage IV Chronic Kidney Disease N18.4  The various methods of treatment have been discussed with the patient and family. After consideration of risks, benefits and other options for treatment, the patient has consented to  Procedure(s): ARTERIOVENOUS (AV) FISTULA CREATION (Left) as a surgical intervention .  The patient's history has been reviewed, patient examined, no change in status, stable for surgery.  I have reviewed the patient's chart and labs.  Questions were answered to the patient's satisfaction.     Annamarie Major

## 2015-04-25 NOTE — Anesthesia Procedure Notes (Signed)
Date/Time: 04/25/2015 10:25 AM Performed by: Car[facility] Living Oxygen Delivery Method: Nasal cannula

## 2015-04-25 NOTE — Progress Notes (Signed)
CBG 54 Dr Conrad Sandy called and new orders noted

## 2015-04-25 NOTE — Transfer of Care (Signed)
Immediate Anesthesia Transfer of Care Note  Patient: Eugene [name]  Procedure(s) Performed: Procedure(s): ARTERIOVENOUS (AV) BRACHIOCEPHALIC FISTULA CREATION (Left)  Patient Location: PACU  Anesthesia Type:MAC  Level of Consciousness: awake, alert , oriented and patient cooperative  Airway & Oxygen Therapy: Patient Spontanous Breathing and Patient connected to nasal cannula oxygen  Post-op Assessment: Report given to RN, Post -op Vital signs reviewed and stable and Patient moving all extremities X 4  Post vital signs: Reviewed and stable  Last Vitals:  Filed Vitals:   04/25/15 0741  BP: 160/57  Pulse: 66  Temp: 36.6 C  Resp: 18    Complications: No apparent anesthesia complications

## 2015-04-25 NOTE — Progress Notes (Signed)
cbg of 29 Dr Conrad Justice informed no new orders at this time

## 2015-04-25 NOTE — Anesthesia Preprocedure Evaluation (Addendum)
Anesthesia Evaluation  Patient identified by MRN, date of birth, ID band Patient awake    Reviewed: Allergy & Precautions, NPO status , Patient's Chart, lab work & pertinent test results  Airway Mallampati: II  TM Distance: >3 FB Neck ROM: Full    Dental  (+) Teeth Intact, Dental Advisory Given   Pulmonary former smoker,    Pulmonary exam normal        Cardiovascular hypertension, Pt. on medications Normal cardiovascular exam     Neuro/Psych    GI/Hepatic   Endo/Other  diabetes, Well Controlled, Type 2, Oral Hypoglycemic AgentsMorbid obesity  Renal/GU Renal InsufficiencyRenal disease     Musculoskeletal   Abdominal   Peds  Hematology  (+) anemia ,   Anesthesia Other Findings   Reproductive/Obstetrics                            Anesthesia Physical Anesthesia Plan  ASA: III  Anesthesia Plan: MAC   Post-op Pain Management:    Induction: Intravenous  Airway Management Planned: Natural Airway  Additional Equipment:   Intra-op Plan:   Post-operative Plan:   Informed Consent: I have reviewed the patients History and Physical, chart, labs and discussed the procedure including the risks, benefits and alternatives for the proposed anesthesia with the patient or authorized representative who has indicated his/her understanding and acceptance.   Dental advisory given  Plan Discussed with: CRNA and Surgeon  Anesthesia Plan Comments:        Anesthesia Quick Evaluation

## 2015-04-25 NOTE — Op Note (Signed)
    Patient name: Eugene [name] MRN: [id number] DOB: 01/17/1946 Sex: male  04/25/2015 Pre-operative Diagnosis: Chronic renal insufficiency Post-operative diagnosis:  Same Surgeon:  Annamarie Major Assistants:  Gerri Lins Procedure:   Left brachiocephalic fistula Anesthesia:  Mac plus local Blood Loss:  See anesthesia record Specimens:  None  Findings:  Excellent vein, 5 mm.  Healthy artery, 3 mm  Indications:  Patient is in need of dialysis access.  Vein mapping showed adequate cephalic vein  Procedure:  The patient was identified in the holding area and taken to [place] [facility]  The patient was then placed supine on the table. MAC anesthesia was administered.  The patient was prepped and draped in the usual sterile fashion.  A time out was called and antibiotics were administered.  I evaluated the cephalic vein at the wrist and did not feel that it was of adequate diameter.  I therefore decided to proceed with a brachiocephalic fistula.  1% lidocaine was used for local anesthesia.  A transverse incision was made at the antecubital crease.  I dissected out the median cubital vein which was a healthy 5 mm vein.  It was mobilized throughout the width of the incision and marked with an ink pen for orientation.  Side branches were divided between silk ties and metal clips.  I then dissected out the brachial artery.  This was a disease-free 3 mm artery.  3000 and his heparin was then administered.  The vein was ligated distally with a 2-0 silk tie.  It was flushed with heparinized saline.  It distended nicely.  The artery was then occluded with Serafin clamps.  A #11 blade was used to make an arteriotomy which was extended longitudinally with Potts scissors.  The vein was then spatulated and a end-to-side anastomosis was created with running 6-0 Prolene.  Prior to completion, the appropriate flushing maneuvers were performed and the anastomosis was completed.  The patient had monophasic/biphasic  signals at the wrist which augmented with fistula compression.  There was an excellent thrill within the fistula.  25 mg of protamine was administered.  The incision was closed with 2 layers of Vicryl followed by Dermabond.  There were no immediate complications.   Disposition:  To PACU in stable condition.   Theotis Burrow, M.D. [facility] [place] Office: [phone] Pager:  [phone]

## 2015-04-25 NOTE — H&P (View-Only) (Signed)
Patient name: Eugene [name] MRN: [id number] DOB: 07-10-1946 Sex: male   Referred by: Dr. Justin Mend  Reason for referral:  Chief Complaint  Patient presents with  . Re-evaluation    eval for new access     HISTORY OF PRESENT ILLNESS: This is a 69 year old gentleman who is referred by Dr. Justin Mend for evaluation for dialysis access.  The patient is right handed.  He is not yet on dialysis.  He states his renal failure is secondary to contrast administration as well as hypertension.  The patient has a history of rectal cancer, for which she underwent surgery and radiation.  He also suffers from hypertension which is medically managed.  His hypercholesterolemia is managed with a statin.  He is a former smoker.  The patient suffers from diabetes.  His last hemoglobin A1c was 6.5.  Past Medical History  Diagnosis Date  . Hypertension   . Type 2 diabetes mellitus ([place])   . Anemia   . Renal disorder   . Cancer [facility])     rectal ca    Past Surgical History  Procedure Laterality Date  . Elbow surgery Right 2008    right-tendon and lipoma  . Tonsillectomy  1992  . Rectal surgery N/A 2009    rectal surgery for cancer  . Eye surgery Bilateral 1986    lens implant    Social History   Social History  . Marital Status: Married    Spouse Name: N/A  . Number of Children: N/A  . Years of Education: N/A   Occupational History  . Not on file.   Social History Main Topics  . Smoking status: Former Smoker -- 1.00 packs/day for 10 years    Types: Cigarettes    Quit date: 09/28/1975  . Smokeless tobacco: Not on file  . Alcohol Use: 8.4 oz/week    14 Shots of liquor per week     Comment: a couple drinks a night-bourbon (2 oz)  . Drug Use: No  . Sexual Activity: Yes    Birth Control/ Protection: None   Other Topics Concern  . Not on file   Social History Narrative    Family History  Problem Relation Age of Onset  . Heart attack Mother   . Heart disease Father     before age  49  . Hyperlipidemia Father   . Heart attack Father   . Peripheral vascular disease Father     Allergies as of 04/14/2015  . (No Known Allergies)    Current Outpatient Prescriptions on File Prior to Visit  Medication Sig Dispense Refill  . acetaminophen (TYLENOL) 325 MG tablet Take 650 mg by mouth every 6 (six) hours as needed for mild pain.    Marland Kitchen allopurinol (ZYLOPRIM) 100 MG tablet Take 500 mg by mouth 2 (two) times daily. Pt takes one tablet in AM and one tablet in PM.    . aspirin EC 81 MG EC tablet Take 1 tablet (81 mg total) by mouth daily. 30 tablet 2  . atorvastatin (LIPITOR) 20 MG tablet Take 20 mg by mouth at bedtime.    . furosemide (LASIX) 80 MG tablet Take 1 tablet (80 mg total) by mouth daily. 30 tablet 2  . glimepiride (AMARYL) 2 MG tablet Take 2 mg by mouth daily before breakfast.    . labetalol (NORMODYNE) 200 MG tablet Take 200 mg by mouth 2 (two) times daily.    . Multiple Vitamin (MULTIVITAMIN WITH MINERALS) TABS tablet Take 1  tablet by mouth daily.    . Omega-3 Fatty Acids (FISH OIL) 1200 MG CAPS Take 1 capsule by mouth daily.     No current facility-administered medications on file prior to visit.     REVIEW OF SYSTEMS: Cardiovascular: No chest pain, chest pressure, palpitations, orthopnea, or dyspnea on exertion. No claudication or rest pain,  No history of DVT or phlebitis.  Positive for leg swelling Pulmonary: No productive cough, asthma or wheezing. Neurologic: No weakness, paresthesias, aphasia, or amaurosis. No dizziness. Hematologic: No bleeding problems or clotting disorders. Musculoskeletal: No joint pain or joint swelling. Gastrointestinal: No blood in stool or hematemesis Genitourinary: No dysuria or hematuria. Psychiatric:: No history of major depression. Integumentary: No rashes or ulcers. Constitutional: No fever or chills.  PHYSICAL EXAMINATION:  Filed Vitals:   04/14/15 0841 04/14/15 0844  BP: 152/58 180/67  Pulse: 72   Height: 5\' 7"   (1.702 m)   Weight: 265 lb 8 oz (120.43 kg)   SpO2: 98%    Body mass index is 41.57 kg/(m^2). General: The patient appears their stated age.   HEENT:  No gross abnormalities Pulmonary: Respirations are non-labored Abdomen: Soft and non-tender  Musculoskeletal: There are no major deformities.   Neurologic: No focal weakness or paresthesias are detected, Skin: There are no ulcer or rashes noted. Psychiatric: The patient has normal affect. Cardiovascular: Palpable radial pulse.  Pitting edema, bilateral lower extremity  Diagnostic Studies: I have reviewed his vascular lab studies.  He has triphasic arterial waveforms.  The cephalic vein of the left is excellent beginning the antecubital crease.  It gets down to 0.22 mm at the wrist.  Assessment:  Chronic renal insufficiency, stage IV Plan: I discussed proceeding with a left arm fistula.  I would evaluate his cephalic vein at the wrist and determine whether or not a radiocephalic fistula is the appropriate operation for if he should have a brachiocephalic fistula.  I discussed the risks and benefits of the procedure with the patient and his wife.  We discussed the possibility of non-maturation and the need for future interventions.  All of his questions were answered.  His operations been scheduled for Friday, October 14  V. Leia Alf, M.D. [facility] [place] Office: [phone] Pager:  [phone]

## 2015-04-25 NOTE — Anesthesia Postprocedure Evaluation (Signed)
Anesthesia Post Note  Patient: Eugene [name]  Procedure(s) Performed: Procedure(s) (LRB): ARTERIOVENOUS (AV) BRACHIOCEPHALIC FISTULA CREATION (Left)  Anesthesia type: MAC  Patient location: PACU  Post pain: Pain level controlled  Post assessment: Patient's Cardiovascular Status Stable  Last Vitals:  Filed Vitals:   04/25/15 1215  BP: 154/58  Pulse: 69  Temp:   Resp: 16    Post vital signs: Reviewed and stable  Level of consciousness: sedated  Complications: No apparent anesthesia complications

## 2015-04-28 ENCOUNTER — Telehealth: Payer: Self-pay | Admitting: Surgery

## 2015-04-28 ENCOUNTER — Encounter (HOSPITAL_COMMUNITY): Payer: Self-pay | Admitting: Surgery

## 2015-04-28 NOTE — Telephone Encounter (Signed)
-----   Message from Mena Goes, RN sent at 04/25/2015 12:28 PM EDT ----- Regarding: Schedule   ----- Message -----    From: Ulyses Amor, PA-C    Sent: 04/25/2015  11:12 AM      To: [facility] Charge Pool  S/P left av fistula f/u in 6 weeks with fistula duplex Dr.Brabham

## 2015-04-28 NOTE — Telephone Encounter (Signed)
LM for pt re appt, dpm °

## 2015-05-19 ENCOUNTER — Telehealth: Payer: Self-pay

## 2015-05-19 NOTE — Telephone Encounter (Signed)
Phone call from pt.  Reported intermittent coldness in left hand since his surgery.  Reported cramping of left hand, intermittently, that has been occuring over past week.  Reported he has some numbness and cramping in (L) hand when he positions his hand "curled under the arm at night.".  Stated he is able to grip with the left hand between episodes of cramping and cold sensation.  Stated "I didn't know if I had to give it more time to heal or not."  Advised will plan to bring him in for evaluation, after discussing with Dr. Trula Slade.  Verb. understanding.

## 2015-05-19 NOTE — Telephone Encounter (Signed)
Discussed with Dr. Trula Slade.  Advised to encourage pt. to do his arm exercises, and to avoid positioning hand curled under the arm.  Advised to schedule appt. 11/14 for access duplex and office appt.  Pt. was advised of Dr. Stephens Shire recommendations and to expect a phone call re: new appt. on 11/14.  Verb. understanding.

## 2015-05-20 NOTE — Telephone Encounter (Signed)
Spoke with patient regarding appointments, dpm

## 2015-05-22 ENCOUNTER — Encounter: Payer: Self-pay | Admitting: Surgery

## 2015-05-23 ENCOUNTER — Other Ambulatory Visit: Payer: Self-pay | Admitting: *Deleted

## 2015-05-23 DIAGNOSIS — N186 End stage renal disease: Secondary | ICD-10-CM

## 2015-05-23 DIAGNOSIS — Z4931 Encounter for adequacy testing for hemodialysis: Secondary | ICD-10-CM

## 2015-05-26 ENCOUNTER — Ambulatory Visit (HOSPITAL_COMMUNITY)
Admission: RE | Admit: 2015-05-26 | Discharge: 2015-05-26 | Disposition: A | Discharge status: Home / Self Care | Payer: Medicare Other | Source: Ambulatory Visit | Attending: Surgery | Admitting: Surgery

## 2015-05-26 ENCOUNTER — Ambulatory Visit (INDEPENDENT_AMBULATORY_CARE_PROVIDER_SITE_OTHER): Payer: Self-pay | Admitting: Surgery

## 2015-05-26 ENCOUNTER — Other Ambulatory Visit: Payer: Self-pay

## 2015-05-26 ENCOUNTER — Encounter: Payer: Self-pay | Admitting: Surgery

## 2015-05-26 VITALS — BP 163/55 | HR 63 | Ht 67.0 in | Wt 228.0 lb

## 2015-05-26 DIAGNOSIS — Z4931 Encounter for adequacy testing for hemodialysis: Secondary | ICD-10-CM

## 2015-05-26 DIAGNOSIS — N186 End stage renal disease: Secondary | ICD-10-CM | POA: Insufficient documentation

## 2015-05-26 DIAGNOSIS — N184 Chronic kidney disease, stage 4 (severe): Secondary | ICD-10-CM

## 2015-05-26 NOTE — Progress Notes (Signed)
Patient name: Eugene [name] MRN: [id number] DOB: April 08, 1946 Sex: male     Chief Complaint  Patient presents with  . Re-evaluation    c/o intermittent coldness, cramping and numbness of L hand since surgery 10/14    HISTORY OF PRESENT ILLNESS: The patient is back for follow-up.  On 04/25/2015, he underwent a left brachiocephalic fistula.  He has had some numbness and coolness in his left hand.  Past Medical History  Diagnosis Date  . Hypertension   . Type 2 diabetes mellitus ([place])   . Anemia   . Renal disorder   . Cancer ([place])     rectal ca  . Diarrhea     intermittent since rectal surgery  . 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 Blind in both eyes     Past Surgical History  Procedure Laterality Date  . Elbow surgery Right 2008    right-tendon and lipoma  . Tonsillectomy  1992  . Rectal surgery N/A 2009    rectal surgery for cancer  . Eye surgery Bilateral 1986    lens implant  . Colonoscopy    . Av fistula placement Left 04/25/2015    Procedure: ARTERIOVENOUS (AV) BRACHIOCEPHALIC FISTULA CREATION;  Surgeon: Eugene Mitchell, MD;  Location: [facility] OR;  Service: Vascular;  Laterality: Left;    Social History   Social History  . Marital Status: Married    Spouse Name: N/A  . Number of Children: N/A  . Years of Education: N/A   Occupational History  . Not on file.   Social History Main Topics  . Smoking status: Former Smoker -- 1.00 packs/day for 10 years    Types: Cigarettes    Quit date: 09/28/1975  . Smokeless tobacco: Never Used  . Alcohol Use: 8.4 oz/week    14 Shots of liquor per week     Comment: a couple drinks a night-bourbon (2 oz)  . Drug Use: No  . Sexual Activity: Yes    Birth Control/ Protection: None   Other Topics Concern  . Not on file   Social History Narrative    Family History  Problem Relation Age of Onset  . Heart attack Mother   . Heart disease Father     before age 60  . Hyperlipidemia Father   .  Heart attack Father   . Peripheral vascular disease Father     Allergies as of 05/26/2015  . (No Known Allergies)    Current Outpatient Prescriptions on File Prior to Visit  Medication Sig Dispense Refill  . acetaminophen (TYLENOL) 325 MG tablet Take 650 mg by mouth every 6 (six) hours as needed for mild pain.    Marland Kitchen allopurinol (ZYLOPRIM) 100 MG tablet Take 100 mg by mouth 2 (two) times daily. Pt takes one tablet in AM and one tablet in PM.    . aspirin EC 81 MG EC tablet Take 1 tablet (81 mg total) by mouth daily. 30 tablet 2  . atorvastatin (LIPITOR) 20 MG tablet Take 20 mg by mouth at bedtime.    . furosemide (LASIX) 80 MG tablet Take 1 tablet (80 mg total) by mouth daily. (Patient taking differently: Take 40-80 mg by mouth daily. 80mg  in the morning and in the evening) 30 tablet 2  . glimepiride (AMARYL) 2 MG tablet Take 1 mg by mouth daily before breakfast.     . labetalol (NORMODYNE) 200 MG tablet Take 200 mg by mouth  2 (two) times daily.    . Multiple Vitamin (MULTIVITAMIN WITH MINERALS) TABS tablet Take 1 tablet by mouth daily.    . Omega-3 Fatty Acids (FISH OIL) 1200 MG CAPS Take 1 capsule by mouth daily.    Marland Kitchen oxyCODONE-acetaminophen (PERCOCET/ROXICET) 5-325 MG tablet Take 1 tablet by mouth every 6 (six) hours as needed. 30 tablet 0   No current facility-administered medications on file prior to visit.     PHYSICAL EXAMINATION:   Vital signs are  Filed Vitals:   05/26/15 1014  BP: 163/55  Pulse: 63  Height: 5\' 7"  (1.702 m)  Weight: 228 lb (103.42 kg)  SpO2: 96%   Body mass index is 35.7 kg/(m^2). General: The patient appears their stated age. HEENT:  No gross abnormalities Pulmonary:  Non labored breathing Palpable thrill within left brachiocephalic fistula. Good grip strength in left arm   Diagnostic Studies Duplex of the fistula shows an excellent caliber vein ranging from 0.69-0.86 cm.  This is on the deep side.  Assessment: Chronic renal  insufficiency Plan: The patient needs to have his fistula elevated as I think it is too deep for use currently.  He is having some mild steal symptoms, and therefore I think he would benefit from banding of the proximal anastomosis in addition to fistula elevation.  Eugene [name], M.D. [facility] [place] Office: [phone] Pager:  [phone]

## 2015-06-09 ENCOUNTER — Encounter (HOSPITAL_COMMUNITY): Payer: Medicare Other

## 2015-06-09 ENCOUNTER — Encounter: Payer: Medicare Other | Admitting: Surgery

## 2015-06-11 ENCOUNTER — Encounter (HOSPITAL_COMMUNITY): Payer: Self-pay | Admitting: *Deleted

## 2015-06-11 MED ORDER — SODIUM CHLORIDE 0.9 % IV SOLN
INTRAVENOUS | Status: DC
  Administered 2015-06-12: 07:00:00 via INTRAVENOUS

## 2015-06-11 MED ORDER — DEXTROSE 5 % IV SOLN
1.5000 g | INTRAVENOUS | Status: AC
  Administered 2015-06-12: 1.5 g via INTRAVENOUS
  Administered 2015-06-12: 07:00:00 via INTRAVENOUS
  Filled 2015-06-11: qty 1.5

## 2015-06-11 MED ORDER — CHLORHEXIDINE GLUCONATE CLOTH 2 % EX PADS: 6.0000 | MEDICATED_PAD | Freq: Once | CUTANEOUS | Status: DC

## 2015-06-11 NOTE — Progress Notes (Signed)
Spoke with pt for pre-op call. He denies any recent chest pain or sob.  Stress test - 02/24/15 in [facility] Echo - 02/24/15 in [facility] EKG -02/22/15 in [facility]

## 2015-06-12 ENCOUNTER — Ambulatory Visit (HOSPITAL_COMMUNITY): Payer: Medicare Other | Admitting: Certified Registered Nurse Anesthetist

## 2015-06-12 ENCOUNTER — Ambulatory Visit (HOSPITAL_COMMUNITY)
Admission: RE | Admit: 2015-06-12 | Discharge: 2015-06-12 | Disposition: A | Discharge status: Home / Self Care | Payer: Medicare Other | Source: Ambulatory Visit | Attending: Surgery | Admitting: Surgery

## 2015-06-12 ENCOUNTER — Encounter (HOSPITAL_COMMUNITY)
Admission: RE | Disposition: A | Discharge status: Home / Self Care | Payer: Self-pay | Source: Ambulatory Visit | Attending: Surgery

## 2015-06-12 ENCOUNTER — Encounter (HOSPITAL_COMMUNITY): Payer: Self-pay | Admitting: General Practice

## 2015-06-12 DIAGNOSIS — Z7984 Long term (current) use of oral hypoglycemic drugs: Secondary | ICD-10-CM | POA: Diagnosis not present

## 2015-06-12 DIAGNOSIS — I129 Hypertensive chronic kidney disease with stage 1 through stage 4 chronic kidney disease, or unspecified chronic kidney disease: Secondary | ICD-10-CM | POA: Insufficient documentation

## 2015-06-12 DIAGNOSIS — Z7982 Long term (current) use of aspirin: Secondary | ICD-10-CM | POA: Diagnosis not present

## 2015-06-12 DIAGNOSIS — Z79899 Other long term (current) drug therapy: Secondary | ICD-10-CM | POA: Diagnosis not present

## 2015-06-12 DIAGNOSIS — Y832 Surgical operation with anastomosis, bypass or graft as the cause of abnormal reaction of the patient, or of later complication, without mention of misadventure at the time of the procedure: Secondary | ICD-10-CM | POA: Insufficient documentation

## 2015-06-12 DIAGNOSIS — T82898A Other specified complication of vascular prosthetic devices, implants and grafts, initial encounter: Secondary | ICD-10-CM | POA: Diagnosis present

## 2015-06-12 DIAGNOSIS — N184 Chronic kidney disease, stage 4 (severe): Secondary | ICD-10-CM

## 2015-06-12 DIAGNOSIS — Z87891 Personal history of nicotine dependence: Secondary | ICD-10-CM | POA: Insufficient documentation

## 2015-06-12 DIAGNOSIS — E1122 Type 2 diabetes mellitus with diabetic chronic kidney disease: Secondary | ICD-10-CM | POA: Diagnosis not present

## 2015-06-12 DIAGNOSIS — N189 Chronic kidney disease, unspecified: Secondary | ICD-10-CM | POA: Insufficient documentation

## 2015-06-12 HISTORY — PX: AV FISTULA PLACEMENT: SHX1204

## 2015-06-12 LAB — POCT I-STAT 4, (NA,K, GLUC, HGB,HCT)
GLUCOSE: 62 mg/dL — AB (ref 65–99)
HCT: 32 % — ABNORMAL LOW (ref 39.0–52.0)
HEMOGLOBIN: 10.9 g/dL — AB (ref 13.0–17.0)
POTASSIUM: 3.8 mmol/L (ref 3.5–5.1)
SODIUM: 137 mmol/L (ref 135–145)

## 2015-06-12 LAB — GLUCOSE, CAPILLARY
GLUCOSE-CAPILLARY: 53 mg/dL — AB (ref 65–99)
Glucose-Capillary: 113 mg/dL — ABNORMAL HIGH (ref 65–99)
Glucose-Capillary: 106 mg/dL — ABNORMAL HIGH (ref 65–99)

## 2015-06-12 SURGERY — ARTERIOVENOUS (AV) FISTULA CREATION
Anesthesia: General | Site: Arm Upper | Laterality: Left

## 2015-06-12 MED ORDER — PHENYLEPHRINE HCL 10 MG/ML IJ SOLN
10.0000 mg | INTRAMUSCULAR | Status: DC | PRN
Start: 2015-06-12 — End: 2015-06-12
  Administered 2015-06-12: 20 ug/min via INTRAVENOUS

## 2015-06-12 MED ORDER — OXYCODONE-ACETAMINOPHEN 5-325 MG PO TABS: 1.0000 | ORAL_TABLET | Freq: Four times a day (QID) | ORAL | Status: AC | PRN

## 2015-06-12 MED ORDER — SODIUM CHLORIDE 0.9 % IJ SOLN
INTRAMUSCULAR | Status: AC
  Filled 2015-06-12: qty 10

## 2015-06-12 MED ORDER — EPHEDRINE SULFATE 50 MG/ML IJ SOLN
INTRAMUSCULAR | Status: DC | PRN
  Administered 2015-06-12: 10 mg via INTRAVENOUS
  Administered 2015-06-12 (×2): 5 mg via INTRAVENOUS

## 2015-06-12 MED ORDER — EPHEDRINE SULFATE 50 MG/ML IJ SOLN
INTRAMUSCULAR | Status: AC
  Filled 2015-06-12: qty 1

## 2015-06-12 MED ORDER — ONDANSETRON HCL 4 MG/2ML IJ SOLN
INTRAMUSCULAR | Status: DC | PRN
  Administered 2015-06-12: 4 mg via INTRAVENOUS

## 2015-06-12 MED ORDER — MIDAZOLAM HCL 2 MG/2ML IJ SOLN
INTRAMUSCULAR | Status: AC
  Filled 2015-06-12: qty 2

## 2015-06-12 MED ORDER — PHENYLEPHRINE HCL 10 MG/ML IJ SOLN
INTRAMUSCULAR | Status: DC | PRN
  Administered 2015-06-12 (×3): 120 ug via INTRAVENOUS
  Administered 2015-06-12: 40 ug via INTRAVENOUS
  Administered 2015-06-12: 120 ug via INTRAVENOUS

## 2015-06-12 MED ORDER — FENTANYL CITRATE (PF) 100 MCG/2ML IJ SOLN
INTRAMUSCULAR | Status: DC | PRN
  Administered 2015-06-12: 100 ug via INTRAVENOUS
  Administered 2015-06-12: 50 ug via INTRAVENOUS

## 2015-06-12 MED ORDER — GLYCOPYRROLATE 0.2 MG/ML IJ SOLN
INTRAMUSCULAR | Status: DC | PRN
  Administered 2015-06-12 (×2): 0.1 mg via INTRAVENOUS

## 2015-06-12 MED ORDER — PHENYLEPHRINE 40 MCG/ML (10ML) SYRINGE FOR IV PUSH (FOR BLOOD PRESSURE SUPPORT)
PREFILLED_SYRINGE | INTRAVENOUS | Status: AC
  Filled 2015-06-12: qty 10

## 2015-06-12 MED ORDER — FENTANYL CITRATE (PF) 250 MCG/5ML IJ SOLN
INTRAMUSCULAR | Status: AC
  Filled 2015-06-12: qty 5

## 2015-06-12 MED ORDER — DEXTROSE 50 % IV SOLN
INTRAVENOUS | Status: AC
  Filled 2015-06-12: qty 50

## 2015-06-12 MED ORDER — GLYCOPYRROLATE 0.2 MG/ML IJ SOLN
INTRAMUSCULAR | Status: AC
  Filled 2015-06-12: qty 1

## 2015-06-12 MED ORDER — PROPOFOL 10 MG/ML IV BOLUS
INTRAVENOUS | Status: AC
  Filled 2015-06-12: qty 20

## 2015-06-12 MED ORDER — DEXTROSE 50 % IV SOLN
25.0000 mL | Freq: Once | INTRAVENOUS | Status: AC
  Administered 2015-06-12: 25 mL via INTRAVENOUS
  Filled 2015-06-12: qty 50

## 2015-06-12 MED ORDER — ARTIFICIAL TEARS OP OINT
TOPICAL_OINTMENT | OPHTHALMIC | Status: DC | PRN
  Administered 2015-06-12: 1 via OPHTHALMIC

## 2015-06-12 MED ORDER — 0.9 % SODIUM CHLORIDE (POUR BTL) OPTIME
TOPICAL | Status: DC | PRN
  Administered 2015-06-12: 1000 mL

## 2015-06-12 MED ORDER — PROPOFOL 10 MG/ML IV BOLUS
INTRAVENOUS | Status: DC | PRN
  Administered 2015-06-12: 160 mg via INTRAVENOUS

## 2015-06-12 MED ORDER — MIDAZOLAM HCL 5 MG/5ML IJ SOLN
INTRAMUSCULAR | Status: DC | PRN
  Administered 2015-06-12: 1 mg via INTRAVENOUS

## 2015-06-12 MED ORDER — LIDOCAINE HCL (CARDIAC) 20 MG/ML IV SOLN
INTRAVENOUS | Status: DC | PRN
  Administered 2015-06-12: 100 mg via INTRAVENOUS

## 2015-06-12 MED ORDER — LIDOCAINE-EPINEPHRINE (PF) 1 %-1:200000 IJ SOLN
INTRAMUSCULAR | Status: AC
  Filled 2015-06-12: qty 30

## 2015-06-12 MED ORDER — SODIUM CHLORIDE 0.9 % IV SOLN
INTRAVENOUS | Status: DC | PRN
  Administered 2015-06-12: 08:00:00

## 2015-06-12 SURGICAL SUPPLY — 41 items
ARMBAND PINK RESTRICT EXTREMIT (MISCELLANEOUS) ×3 IMPLANT
CANISTER SUCTION 2500CC (MISCELLANEOUS) ×3 IMPLANT
CLIP TI MEDIUM 6 (CLIP) ×3 IMPLANT
CLIP TI WIDE RED SMALL 6 (CLIP) ×3 IMPLANT
COVER PROBE W GEL 5X96 (DRAPES) IMPLANT
ELECT REM PT RETURN 9FT ADLT (ELECTROSURGICAL) ×3
ELECTRODE REM PT RTRN 9FT ADLT (ELECTROSURGICAL) ×1 IMPLANT
GLOVE BIO SURGEON STRL SZ 6.5 (GLOVE) ×2 IMPLANT
GLOVE BIO SURGEONS STRL SZ 6.5 (GLOVE) ×1
GLOVE BIOGEL PI IND STRL 6.5 (GLOVE) ×1 IMPLANT
GLOVE BIOGEL PI IND STRL 7.0 (GLOVE) ×1 IMPLANT
GLOVE BIOGEL PI IND STRL 7.5 (GLOVE) ×2 IMPLANT
GLOVE BIOGEL PI IND STRL 8 (GLOVE) ×1 IMPLANT
GLOVE BIOGEL PI INDICATOR 6.5 (GLOVE) ×2
GLOVE BIOGEL PI INDICATOR 7.0 (GLOVE) ×2
GLOVE BIOGEL PI INDICATOR 7.5 (GLOVE) ×4
GLOVE BIOGEL PI INDICATOR 8 (GLOVE) ×2
GLOVE ECLIPSE 7.0 STRL STRAW (GLOVE) ×3 IMPLANT
GLOVE SURG SS PI 7.5 STRL IVOR (GLOVE) ×3 IMPLANT
GOWN STRL REUS W/ TWL LRG LVL3 (GOWN DISPOSABLE) ×2 IMPLANT
GOWN STRL REUS W/ TWL XL LVL3 (GOWN DISPOSABLE) ×1 IMPLANT
GOWN STRL REUS W/TWL LRG LVL3 (GOWN DISPOSABLE) ×4
GOWN STRL REUS W/TWL XL LVL3 (GOWN DISPOSABLE) ×2
HEMOSTAT SNOW SURGICEL 2X4 (HEMOSTASIS) IMPLANT
KIT BASIN OR (CUSTOM PROCEDURE TRAY) ×3 IMPLANT
KIT ROOM TURNOVER OR (KITS) ×3 IMPLANT
LIQUID BAND (GAUZE/BANDAGES/DRESSINGS) ×3 IMPLANT
NS IRRIG 1000ML POUR BTL (IV SOLUTION) ×3 IMPLANT
PACK CV ACCESS (CUSTOM PROCEDURE TRAY) ×3 IMPLANT
PAD ARMBOARD 7.5X6 YLW CONV (MISCELLANEOUS) ×6 IMPLANT
PATCH VASC XENOSURE 1CMX6CM (Vascular Products) ×3 IMPLANT
PATCH VASC XENOSURE 1X6 (Vascular Products) ×1 IMPLANT
SUT PROLENE 5 0 C 1 24 (SUTURE) ×6 IMPLANT
SUT PROLENE 6 0 CC (SUTURE) ×6 IMPLANT
SUT SILK 4 0 (SUTURE) ×3
SUT SILK 4-0 18XBRD TIE 12 (SUTURE) ×1 IMPLANT
SUT VIC AB 3-0 SH 27 (SUTURE) ×6
SUT VIC AB 3-0 SH 27X BRD (SUTURE) ×2 IMPLANT
SUT VICRYL 4-0 PS2 18IN ABS (SUTURE) ×6 IMPLANT
UNDERPAD 30X30 INCONTINENT (UNDERPADS AND DIAPERS) ×3 IMPLANT
WATER STERILE IRR 1000ML POUR (IV SOLUTION) ×3 IMPLANT

## 2015-06-12 NOTE — Anesthesia Procedure Notes (Signed)
Procedure Name: LMA Insertion Date/Time: 06/12/2015 7:38 AM Performed by: Willeen Cass P Pre-anesthesia Checklist: Patient identified, Emergency Drugs available, Patient being monitored, Suction available and Timeout performed Patient Re-evaluated:Patient Re-evaluated prior to inductionOxygen Delivery Method: Circle system utilized Preoxygenation: Pre-oxygenation with 100% oxygen Intubation Type: IV induction LMA: LMA inserted LMA Size: 5.0 Number of attempts: 1 Placement Confirmation: positive ETCO2 and breath sounds checked- equal and bilateral Tube secured with: Tape Dental Injury: Teeth and Oropharynx as per pre-operative assessment

## 2015-06-12 NOTE — Anesthesia Postprocedure Evaluation (Signed)
Anesthesia Post Note  Patient: Eugene [name]  Procedure(s) Performed: Procedure(s) (LRB): LEFT UPPER ARM ARTERIOVENOUS (AV) FISTULA ELEVATION AND BANDING USING XENOSURE BIOLOGIC PATCH (Left)  Patient location during evaluation: PACU Anesthesia Type: General Level of consciousness: awake and alert Pain management: pain level controlled Vital Signs Assessment: post-procedure vital signs reviewed and stable Respiratory status: spontaneous breathing, nonlabored ventilation, respiratory function stable and patient connected to nasal cannula oxygen Cardiovascular status: blood pressure returned to baseline and stable Postop Assessment: no signs of nausea or vomiting Anesthetic complications: no    Last Vitals:  Filed Vitals:   06/12/15 0930 06/12/15 1005  BP:  151/71  Pulse: 72 71  Temp:    Resp: 14 15    Last Pain:  Filed Vitals:   06/12/15 1014  PainSc: Puckett DAVID

## 2015-06-12 NOTE — Progress Notes (Signed)
   06/12/15 0644  OBSTRUCTIVE SLEEP APNEA  Have you ever been diagnosed with sleep apnea through a sleep study? No  Do you snore loudly (loud enough to be heard through closed doors)?  1  Do you often feel tired, fatigued, or sleepy during the daytime (such as falling asleep during driving or talking to someone)? 0  Has anyone observed you stop breathing during your sleep? 0  Do you have, or are you being treated for high blood pressure? 1  BMI more than 35 kg/m2? 1  Age > 50 (1-yes) 1  Neck circumference greater than:Male 16 inches or larger, Male 17inches or larger? 1  Male Gender (Yes=1) 1  Obstructive Sleep Apnea Score 6

## 2015-06-12 NOTE — Interval H&P Note (Signed)
History and Physical Interval Note:  06/12/2015 7:17 AM  Eugene [name]  has presented today for surgery, with the diagnosis of Stage IV Chronic Kidney Disease N18.4  The various methods of treatment have been discussed with the patient and family. After consideration of risks, benefits and other options for treatment, the patient has consented to  Procedure(s): ARTERIOVENOUS (AV) FISTULA CREATION (Left) as a surgical intervention .  The patient's history has been reviewed, patient examined, no change in status, stable for surgery.  I have reviewed the patient's chart and labs.  Questions were answered to the patient's satisfaction.     Annamarie Major

## 2015-06-12 NOTE — Transfer of Care (Signed)
Immediate Anesthesia Transfer of Care Note  Patient: Eugene [name]  Procedure(s) Performed: Procedure(s): LEFT UPPER ARM ARTERIOVENOUS (AV) FISTULA ELEVATION AND BANDING USING [place] (Left)  Patient Location: PACU  Anesthesia Type:General  Level of Consciousness: awake, patient cooperative and lethargic  Airway & Oxygen Therapy: Patient Spontanous Breathing and Patient connected to nasal cannula oxygen  Post-op Assessment: Report given to RN, Post -op Vital signs reviewed and stable and Patient moving all extremities  Post vital signs: Reviewed and stable  Last Vitals:  BP 130/70 (90) HR 76 RR 16 SpO2 95% on 4L [place] Resting comfortably, maintains good airway.   Complications: No apparent anesthesia complications

## 2015-06-12 NOTE — Progress Notes (Signed)
Blood sugar 53 on arrival to short stay.  Dr. Glennon Mac notified and received order to give 1/2 amp of D50.  Blood sugar rechecked 15 minutes after receiving D50 and blood sugar increased to 113.  Will continue to monitor patient.

## 2015-06-12 NOTE — Anesthesia Preprocedure Evaluation (Signed)
Anesthesia Evaluation  Patient identified by MRN, date of birth, ID band Patient awake    Reviewed: Allergy & Precautions, NPO status , Patient's Chart, lab work & pertinent test results  Airway Mallampati: II  TM Distance: >3 FB Neck ROM: Full    Dental   Pulmonary former smoker,    Pulmonary exam normal        Cardiovascular hypertension, Pt. on medications Normal cardiovascular exam     Neuro/Psych    GI/Hepatic   Endo/Other  diabetes, Type 2, Oral Hypoglycemic Agents  Renal/GU CRFRenal disease     Musculoskeletal   Abdominal   Peds  Hematology   Anesthesia Other Findings   Reproductive/Obstetrics                             Anesthesia Physical Anesthesia Plan  ASA: III  Anesthesia Plan: General   Post-op Pain Management:    Induction: Intravenous  Airway Management Planned: LMA  Additional Equipment:   Intra-op Plan:   Post-operative Plan: Extubation in OR  Informed Consent: I have reviewed the patients History and Physical, chart, labs and discussed the procedure including the risks, benefits and alternatives for the proposed anesthesia with the patient or authorized representative who has indicated his/her understanding and acceptance.     Plan Discussed with: CRNA and Surgeon  Anesthesia Plan Comments:         Anesthesia Quick Evaluation

## 2015-06-12 NOTE — Op Note (Signed)
    Patient name: Eugene [name] MRN: [id number] DOB: Dec 19, 1945 Sex: male  06/12/2015 Pre-operative Diagnosis: CKD Post-operative diagnosis:  Same Surgeon:  Annamarie Major Assistants:  Lennie Muckle Procedure:   #1: Banding of left arteriovenous fistula   #2: Elevation of left brachiocephalic fistula Anesthesia:  Gen. Blood Loss:  See anesthesia record Specimens:  None  Findings:  Significant improvement and radial artery Doppler signal after banding  Indications:  The patient is proceed undergone brachiocephalic fistula creation on the left.  He has an excellent fistula however it is difficult to feel because of its depth.  In addition, he has been having some mild symptoms of steal.  Therefore he comes in today for elevation and banding  Procedure:  The patient was identified in the holding area and taken to [place] [facility]  The patient was then placed supine on the table. general anesthesia was administered.  The patient was prepped and draped in the usual sterile fashion.  A time out was called and antibiotics were administered.  2 incisions were made directly anterior to the fistula in the left upper arm.  Using cautery and sharp dissection, the fistula was circumferentially isolated and fully mobilized.  Side branches were ligated between silk ties and metal clips.  I then selected a bovine pericardial patch and performed banding of the proximal anastomosis.  After doing this was a significantly improved signal in the radial artery.  There remained good flow within the fistula.  Next, to elevate the fistula, the saphenous tissue was closed with interrupted 3-0 Vicryl.  The skin was closed with 4-0 Vicryl, directly anterior to the vein.  Dermabond was applied.  There were no immediate Patients.   Disposition:  To PACU in stable condition.   Theotis Burrow, M.D. [facility] [place] Office: [phone] Pager:  [phone]

## 2015-06-12 NOTE — H&P (View-Only) (Signed)
Patient name: Eugene [name] MRN: [id number] DOB: 1946/02/05 Sex: male     Chief Complaint  Patient presents with  . Re-evaluation    c/o intermittent coldness, cramping and numbness of L hand since surgery 10/14    HISTORY OF PRESENT ILLNESS: The patient is back for follow-up.  On 04/25/2015, he underwent a left brachiocephalic fistula.  He has had some numbness and coolness in his left hand.  Past Medical History  Diagnosis Date  . Hypertension   . Type 2 diabetes mellitus ([place])   . Anemia   . Renal disorder   . Cancer ([place])     rectal ca  . Diarrhea     intermittent since rectal surgery  . 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 Blind in both eyes     Past Surgical History  Procedure Laterality Date  . Elbow surgery Right 2008    right-tendon and lipoma  . Tonsillectomy  1992  . Rectal surgery N/A 2009    rectal surgery for cancer  . Eye surgery Bilateral 1986    lens implant  . Colonoscopy    . Av fistula placement Left 04/25/2015    Procedure: ARTERIOVENOUS (AV) BRACHIOCEPHALIC FISTULA CREATION;  Surgeon: Serafina Mitchell, MD;  Location: [facility];  Service: Vascular;  Laterality: Left;    Social History   Social History  . Marital Status: Married    Spouse Name: N/A  . Number of Children: N/A  . Years of Education: N/A   Occupational History  . Not on file.   Social History Main Topics  . Smoking status: Former Smoker -- 1.00 packs/day for 10 years    Types: Cigarettes    Quit date: 09/28/1975  . Smokeless tobacco: Never Used  . Alcohol Use: 8.4 oz/week    14 Shots of liquor per week     Comment: a couple drinks a night-bourbon (2 oz)  . Drug Use: No  . Sexual Activity: Yes    Birth Control/ Protection: None   Other Topics Concern  . Not on file   Social History Narrative    Family History  Problem Relation Age of Onset  . Heart attack Mother   . Heart disease Father     before age 99  . Hyperlipidemia Father   .  Heart attack Father   . Peripheral vascular disease Father     Allergies as of 05/26/2015  . (No Known Allergies)    Current Outpatient Prescriptions on File Prior to Visit  Medication Sig Dispense Refill  . acetaminophen (TYLENOL) 325 MG tablet Take 650 mg by mouth every 6 (six) hours as needed for mild pain.    Marland Kitchen allopurinol (ZYLOPRIM) 100 MG tablet Take 100 mg by mouth 2 (two) times daily. Pt takes one tablet in AM and one tablet in PM.    . aspirin EC 81 MG EC tablet Take 1 tablet (81 mg total) by mouth daily. 30 tablet 2  . atorvastatin (LIPITOR) 20 MG tablet Take 20 mg by mouth at bedtime.    . furosemide (LASIX) 80 MG tablet Take 1 tablet (80 mg total) by mouth daily. (Patient taking differently: Take 40-80 mg by mouth daily. 80mg  in the morning and in the evening) 30 tablet 2  . glimepiride (AMARYL) 2 MG tablet Take 1 mg by mouth daily before breakfast.     . labetalol (NORMODYNE) 200 MG tablet Take 200 mg by mouth  2 (two) times daily.    . Multiple Vitamin (MULTIVITAMIN WITH MINERALS) TABS tablet Take 1 tablet by mouth daily.    . Omega-3 Fatty Acids (FISH OIL) 1200 MG CAPS Take 1 capsule by mouth daily.    Marland Kitchen oxyCODONE-acetaminophen (PERCOCET/ROXICET) 5-325 MG tablet Take 1 tablet by mouth every 6 (six) hours as needed. 30 tablet 0   No current facility-administered medications on file prior to visit.     PHYSICAL EXAMINATION:   Vital signs are  Filed Vitals:   05/26/15 1014  BP: 163/55  Pulse: 63  Height: 5\' 7"  (1.702 m)  Weight: 228 lb (103.42 kg)  SpO2: 96%   Body mass index is 35.7 kg/(m^2). General: The patient appears their stated age. HEENT:  No gross abnormalities Pulmonary:  Non labored breathing Palpable thrill within left brachiocephalic fistula. Good grip strength in left arm   Diagnostic Studies Duplex of the fistula shows an excellent caliber vein ranging from 0.69-0.86 cm.  This is on the deep side.  Assessment: Chronic renal  insufficiency Plan: The patient needs to have his fistula elevated as I think it is too deep for use currently.  He is having some mild steal symptoms, and therefore I think he would benefit from banding of the proximal anastomosis in addition to fistula elevation.  [name], M.D. [facility] Office: [phone] Pager:  [phone]

## 2015-06-13 ENCOUNTER — Encounter (HOSPITAL_COMMUNITY): Payer: Self-pay | Admitting: Surgery

## 2015-06-16 ENCOUNTER — Encounter: Payer: Medicare Other | Admitting: Surgery

## 2015-06-16 ENCOUNTER — Encounter (HOSPITAL_COMMUNITY): Payer: Medicare Other

## 2015-06-16 ENCOUNTER — Telehealth: Payer: Self-pay | Admitting: Surgery

## 2015-06-16 NOTE — Telephone Encounter (Signed)
-----   Message from Mena Goes, RN sent at 06/12/2015 10:00 AM EST ----- Regarding: schedule   ----- Message -----    From: Ulyses Amor, PA-C    Sent: 06/12/2015   8:55 AM      To: [facility] Charge Pool  F/U with Dr. Trula Slade in 2 weeks s/p transposition of left AF fistula

## 2015-06-16 NOTE — Telephone Encounter (Signed)
Spoke with pt to schedule, dpm °

## 2015-06-17 ENCOUNTER — Encounter: Payer: Self-pay | Admitting: Surgery

## 2015-06-23 ENCOUNTER — Ambulatory Visit (INDEPENDENT_AMBULATORY_CARE_PROVIDER_SITE_OTHER): Payer: Self-pay | Admitting: Surgery

## 2015-06-23 ENCOUNTER — Encounter: Payer: Self-pay | Admitting: Surgery

## 2015-06-23 VITALS — BP 127/50 | HR 89 | Temp 98.6°F | Resp 18 | Ht 67.5 in | Wt 227.0 lb

## 2015-06-23 DIAGNOSIS — N184 Chronic kidney disease, stage 4 (severe): Secondary | ICD-10-CM

## 2015-06-23 NOTE — Progress Notes (Signed)
Patient name: Eugene [name] MRN: [id number] DOB: 05-04-1946 Sex: male     Chief Complaint  Patient presents with  . Re-evaluation     s/p transposion of left AVF    HISTORY OF PRESENT ILLNESS: The patient is back for follow-up.  On 06/12/2015 he underwent banding and elevation of his left brachiocephalic fistula.  He had been having some mild symptoms of steal prior to his operation that had become more severe.  Today he states that after the procedure he is approximately 30% better.  He has had a couple episodes of cramping in his hand.  Past Medical History  Diagnosis Date  . Hypertension   . Type 2 diabetes mellitus ([place])   . Anemia   . Renal disorder   . Cancer ([place])     rectal ca  . Diarrhea     intermittent since rectal surgery  . 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 Blind in both eyes     Past Surgical History  Procedure Laterality Date  . Elbow surgery Right 2008    right-tendon and lipoma  . Tonsillectomy  1992  . Rectal surgery N/A 2009    rectal surgery for cancer  . Eye surgery Bilateral 1986    lens implant  . Colonoscopy    . Av fistula placement Left 04/25/2015    Procedure: ARTERIOVENOUS (AV) BRACHIOCEPHALIC FISTULA CREATION;  Surgeon: Serafina Mitchell, MD;  Location: [facility];  Service: Vascular;  Laterality: Left;  . Av fistula placement Left 06/12/2015    Procedure: LEFT UPPER ARM ARTERIOVENOUS (AV) FISTULA ELEVATION AND BANDING USING XENOSURE BIOLOGIC PATCH;  Surgeon: Serafina Mitchell, MD;  [facility];  Service: Vascular;  Laterality: Left;    Social History   Social History  . Marital Status: Married    Spouse Name: N/A  . Number of Children: N/A  . Years of Education: N/A   Occupational History  . Not on file.   Social History Main Topics  . Smoking status: Former Smoker -- 1.00 packs/day for 10 years    Types: Cigarettes    Quit date: 09/28/1975  . Smokeless tobacco: Never Used  . Alcohol Use: 8.4 oz/week     14 Shots of liquor per week     Comment: a couple drinks a night-bourbon (2 oz)  . Drug Use: No  . Sexual Activity: Yes    Birth Control/ Protection: None   Other Topics Concern  . Not on file   Social History Narrative    Family History  Problem Relation Age of Onset  . Heart attack Mother   . Heart disease Father     before age 42  . Hyperlipidemia Father   . Heart attack Father   . Peripheral vascular disease Father     Allergies as of 06/23/2015  . (No Known Allergies)    Current Outpatient Prescriptions on File Prior to Visit  Medication Sig Dispense Refill  . acetaminophen (TYLENOL) 325 MG tablet Take 650 mg by mouth every 6 (six) hours as needed for mild pain.    Marland Kitchen allopurinol (ZYLOPRIM) 100 MG tablet Take 100 mg by mouth 2 (two) times daily. Pt takes one tablet in AM and one tablet in PM.    . aspirin EC 81 MG EC tablet Take 1 tablet (81 mg total) by mouth daily. 30 tablet 2  . atorvastatin (LIPITOR) 20 MG tablet Take 20 mg by mouth  at bedtime.    . furosemide (LASIX) 80 MG tablet Take 1 tablet (80 mg total) by mouth daily. (Patient taking differently: Take 80 mg by mouth 2 (two) times daily. ) 30 tablet 2  . glimepiride (AMARYL) 2 MG tablet Take 1 mg by mouth daily before breakfast.     . labetalol (NORMODYNE) 200 MG tablet Take 200 mg by mouth 2 (two) times daily.    . Multiple Vitamin (MULTIVITAMIN WITH MINERALS) TABS tablet Take 1 tablet by mouth daily.    . Omega-3 Fatty Acids (FISH OIL) 1200 MG CAPS Take 1 capsule by mouth daily.    Marland Kitchen oxyCODONE-acetaminophen (PERCOCET/ROXICET) 5-325 MG tablet Take 1 tablet by mouth every 6 (six) hours as needed. (Patient not taking: Reported on 06/23/2015) 30 tablet 0   No current facility-administered medications on file prior to visit.       PHYSICAL EXAMINATION:   Vital signs are  Filed Vitals:   06/23/15 1623  BP: 127/50  Pulse: 89  Temp: 98.6 F (37 C)  Resp: 18  Height: 5' 7.5" (1.715 m)  Weight: 227 lb  (102.967 kg)  SpO2: 94%   Body mass index is 35.01 kg/(m^2). General: The patient appears their stated age. The incisions are well-healed.  There is an excellent thrill within the fistula.  He has a palpable radial pulse.  I listened to the digital arteries, palmar arch, and radial artery.  They were all risk that did augment some with fistula compression He has excellent grip strength in the left hand  Diagnostic Studies None  Assessment: End-stage renal disease Plan: I still think the patient has a component of steal syndrome, however this is manageable currently.  He does have numbness in his third fourth and fifth fingers which more likely correlate to median nerve irritation which should hopefully resolve over time.  I will have the patient come back in one month.  We will get a formal fistula duplex to evaluate for steal syndrome.  I have told him to contact me sooner should his symptoms get worse.  [name], M.D. [facility] Office: [phone] Pager:  [phone]

## 2015-06-24 NOTE — Addendum Note (Signed)
Addended by: Dorthula Rue L on: 06/24/2015 10:22 AM   Modules accepted: Orders

## 2015-07-10 ENCOUNTER — Encounter: Payer: Self-pay | Admitting: Surgery

## 2015-07-17 ENCOUNTER — Encounter: Payer: Self-pay | Admitting: Surgery

## 2015-07-21 ENCOUNTER — Ambulatory Visit (INDEPENDENT_AMBULATORY_CARE_PROVIDER_SITE_OTHER): Payer: Medicare Other | Admitting: Surgery

## 2015-07-21 ENCOUNTER — Encounter: Payer: Self-pay | Admitting: Surgery

## 2015-07-21 ENCOUNTER — Ambulatory Visit (HOSPITAL_COMMUNITY)
Admission: RE | Admit: 2015-07-21 | Discharge: 2015-07-21 | Disposition: A | Discharge status: Home / Self Care | Payer: Medicare Other | Source: Ambulatory Visit | Attending: Surgery | Admitting: Surgery

## 2015-07-21 VITALS — BP 143/58 | HR 65 | Resp 18 | Ht 67.5 in | Wt 235.0 lb

## 2015-07-21 DIAGNOSIS — N184 Chronic kidney disease, stage 4 (severe): Secondary | ICD-10-CM | POA: Diagnosis not present

## 2015-07-21 DIAGNOSIS — E1122 Type 2 diabetes mellitus with diabetic chronic kidney disease: Secondary | ICD-10-CM | POA: Diagnosis not present

## 2015-07-21 DIAGNOSIS — I129 Hypertensive chronic kidney disease with stage 1 through stage 4 chronic kidney disease, or unspecified chronic kidney disease: Secondary | ICD-10-CM | POA: Insufficient documentation

## 2015-07-21 NOTE — Progress Notes (Signed)
Patient name: Eugene [name] MRN: [id number] DOB: Sep 11, 1945 Sex: male     Chief Complaint  Patient presents with  . Re-evaluation    1 month f/u     HISTORY OF PRESENT ILLNESS:  the patient is back for follow-up.  He is status post left brachiocephalic fistula on [id number]. At his follow-up visit, he was having some mild steal symptoms and his fistula was difficult to palpate.  Ultrasound identified that it was very deep.,  Therefore on 06/12/2015 he underwent banding of his left brachiocephalic fistula as well as elevation. He states that his symptoms are better.  He still has some numbness and coolness particularly at night, however he states that this is tolerable.  Past Medical History  Diagnosis Date  . Hypertension   . Type 2 diabetes mellitus ([place])   . Anemia   . Renal disorder   . Cancer ([place])     rectal ca  . Diarrhea     intermittent since rectal surgery  . 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 Blind in both eyes     Past Surgical History  Procedure Laterality Date  . Elbow surgery Right 2008    right-tendon and lipoma  . Tonsillectomy  1992  . Rectal surgery N/A 2009    rectal surgery for cancer  . Eye surgery Bilateral 1986    lens implant  . Colonoscopy    . Av fistula placement Left 04/25/2015    Procedure: ARTERIOVENOUS (AV) BRACHIOCEPHALIC FISTULA CREATION;  Surgeon: Serafina Mitchell, MD;  Location: [facility];  Service: Vascular;  Laterality: Left;  . Av fistula placement Left 06/12/2015    Procedure: LEFT UPPER ARM ARTERIOVENOUS (AV) FISTULA ELEVATION AND BANDING USING XENOSURE BIOLOGIC PATCH;  Surgeon: Serafina Mitchell, MD;  Location: [facility];  Service: Vascular;  Laterality: Left;    Social History   Social History  . Marital Status: Married    Spouse Name: N/A  . Number of Children: N/A  . Years of Education: N/A   Occupational History  . Not on file.   Social History Main Topics  . Smoking status: Former Smoker -- 1.00  packs/day for 10 years    Types: Cigarettes    Quit date: 09/28/1975  . Smokeless tobacco: Never Used  . Alcohol Use: 8.4 oz/week    14 Shots of liquor per week     Comment: a couple drinks a night-bourbon (2 oz)  . Drug Use: No  . Sexual Activity: Yes    Birth Control/ Protection: None   Other Topics Concern  . Not on file   Social History Narrative    Family History  Problem Relation Age of Onset  . Heart attack Mother   . Heart disease Father     before age 42  . Hyperlipidemia Father   . Heart attack Father   . Peripheral vascular disease Father     Allergies as of 07/21/2015  . (No Known Allergies)    Current Outpatient Prescriptions on File Prior to Visit  Medication Sig Dispense Refill  . acetaminophen (TYLENOL) 325 MG tablet Take 650 mg by mouth every 6 (six) hours as needed for mild pain.    Marland Kitchen allopurinol (ZYLOPRIM) 100 MG tablet Take 100 mg by mouth 2 (two) times daily. Pt takes one tablet in AM and one tablet in PM.    . aspirin EC 81 MG EC tablet Take 1 tablet (81 mg  total) by mouth daily. 30 tablet 2  . atorvastatin (LIPITOR) 20 MG tablet Take 20 mg by mouth at bedtime.    . furosemide (LASIX) 80 MG tablet Take 1 tablet (80 mg total) by mouth daily. (Patient taking differently: Take 80 mg by mouth 2 (two) times daily. ) 30 tablet 2  . glimepiride (AMARYL) 2 MG tablet Take 1 mg by mouth daily before breakfast.     . labetalol (NORMODYNE) 200 MG tablet Take 200 mg by mouth 2 (two) times daily.    . Multiple Vitamin (MULTIVITAMIN WITH MINERALS) TABS tablet Take 1 tablet by mouth daily.    . Omega-3 Fatty Acids (FISH OIL) 1200 MG CAPS Take 1 capsule by mouth daily.    Marland Kitchen oxyCODONE-acetaminophen (PERCOCET/ROXICET) 5-325 MG tablet Take 1 tablet by mouth every 6 (six) hours as needed. 30 tablet 0   No current facility-administered medications on file prior to visit.     REVIEW OF SYSTEMS:  see history of present illness  PHYSICAL EXAMINATION:   Vital signs are   Filed Vitals:   07/21/15 1158  BP: 143/58  Pulse: 65  Resp: 18  Height: 5' 7.5" (1.715 m)  Weight: 235 lb (106.595 kg)  SpO2: 96%   Body mass index is 36.24 kg/(m^2). General: The patient appears their stated age.  there is a good thrill within his left brachocephalic fistula.  He also has a palpable left radial pulse    Assessment:  chronic renal insufficiency Plan:  I believe the patient has a functioning left brachiocephalic fistula, it is ready for use whenever he requires initiation of renal replacement therapy.  He does have mild steal symptoms, however he states that they are tolerable. I have encouraged him to continue to do exercises.  I also told him to contact me should he develop a wound that dos not heal appropriately on his left hand.  He will follow up on an as-needed basis.  [name], M.D. [facility] Office: [phone] Pager:  [phone]

## 2016-06-11 DEATH — deceased
